# Patient Record
Sex: Male | Born: 1945 | Race: Black or African American | Hispanic: No | Marital: Married | Smoking: Never smoker
Health system: Southern US, Community
[De-identification: ages and names within clinical notes are randomized; demographics above are authoritative.]

## PROBLEM LIST (undated history)

## (undated) DIAGNOSIS — N189 Chronic kidney disease, unspecified: Secondary | ICD-10-CM

## (undated) DIAGNOSIS — I739 Peripheral vascular disease, unspecified: Secondary | ICD-10-CM

## (undated) DIAGNOSIS — D649 Anemia, unspecified: Secondary | ICD-10-CM

## (undated) DIAGNOSIS — M109 Gout, unspecified: Secondary | ICD-10-CM

## (undated) DIAGNOSIS — I2510 Atherosclerotic heart disease of native coronary artery without angina pectoris: Secondary | ICD-10-CM

## (undated) DIAGNOSIS — I10 Essential (primary) hypertension: Secondary | ICD-10-CM

## (undated) DIAGNOSIS — I639 Cerebral infarction, unspecified: Secondary | ICD-10-CM

## (undated) DIAGNOSIS — E119 Type 2 diabetes mellitus without complications: Secondary | ICD-10-CM

## (undated) DIAGNOSIS — G4730 Sleep apnea, unspecified: Secondary | ICD-10-CM

## (undated) HISTORY — PX: OTHER SURGICAL HISTORY: SHX169

---

## 2003-11-09 ENCOUNTER — Other Ambulatory Visit: Payer: Self-pay

## 2008-05-01 HISTORY — PX: PARATHYROIDECTOMY: SHX19

## 2008-07-20 ENCOUNTER — Ambulatory Visit: Payer: Self-pay | Admitting: Specialist

## 2008-07-27 ENCOUNTER — Ambulatory Visit: Payer: Self-pay | Admitting: Internal Medicine

## 2009-05-01 HISTORY — PX: AORTIC VALVE REPLACEMENT: SHX41

## 2009-09-17 IMAGING — CT CT CHEST W/ CM
1 series · 15 of 32 positions shown, 19 images · non-contrast
Comparison: none

REASON FOR EXAM: right lung abn chest Xray
COMMENTS:

[Series 2: soft tissue · axial · 0.72mm/px · z∈[+434,+674]mm · 15 of 54 slices shown, 19 images]
[im 4/54  soft-tissue]
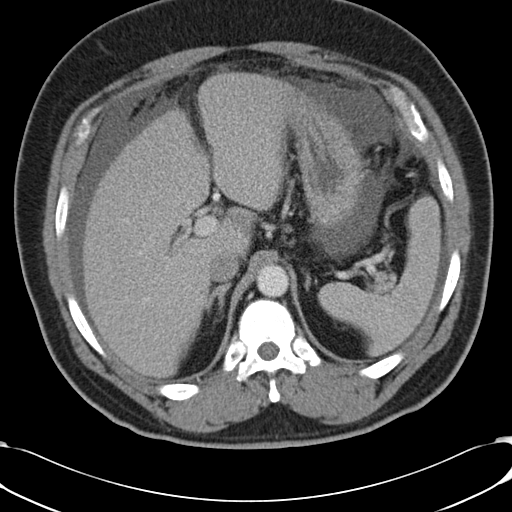
[im 4/54  bone]
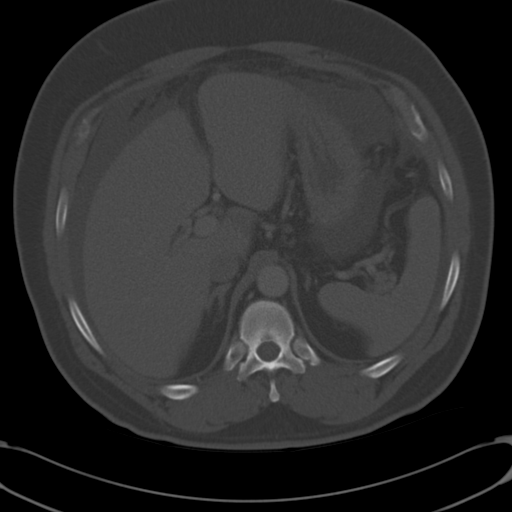
[im 7/54  soft-tissue]
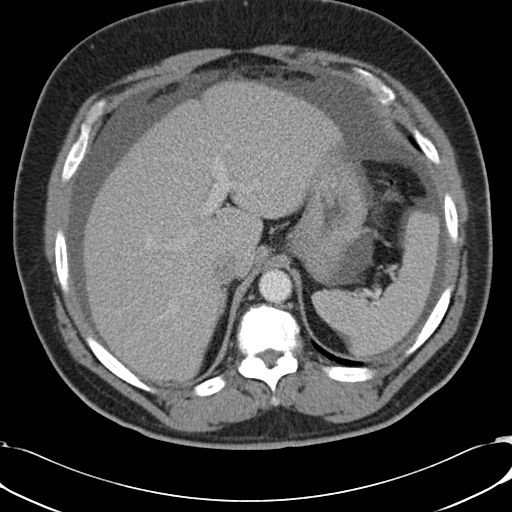
[im 11/54  soft-tissue]
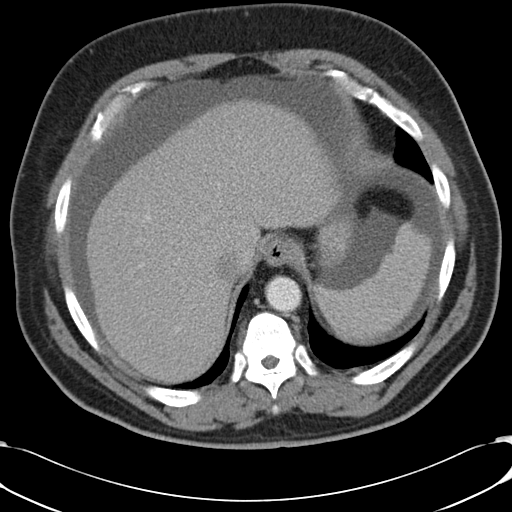
[im 16/54  soft-tissue]
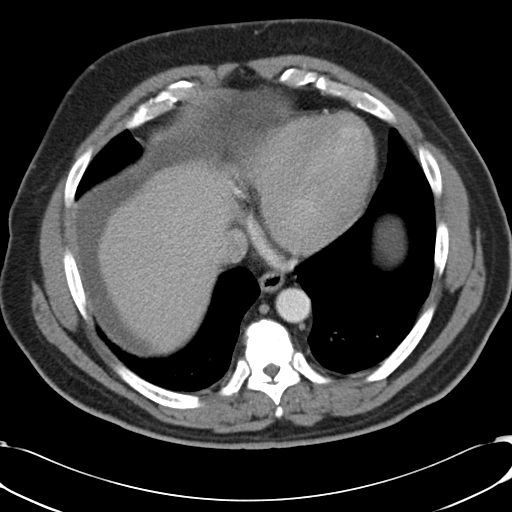
[im 19/54  soft-tissue]
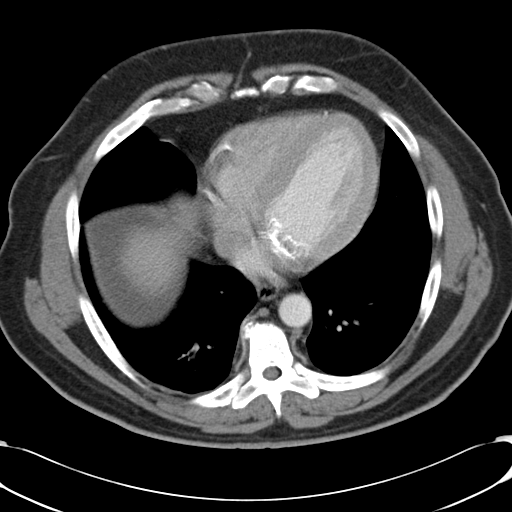
[im 23/54  soft-tissue]
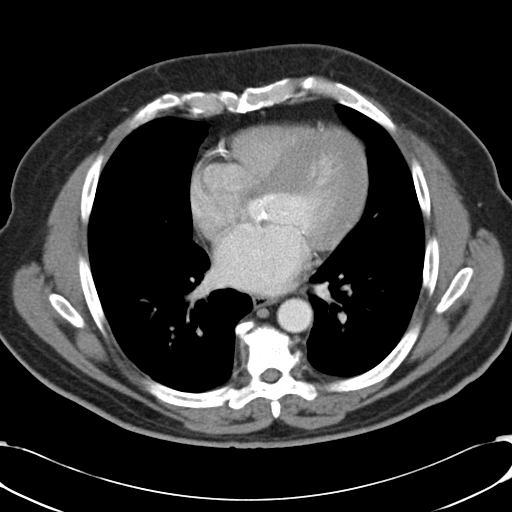
[im 28/54  soft-tissue]
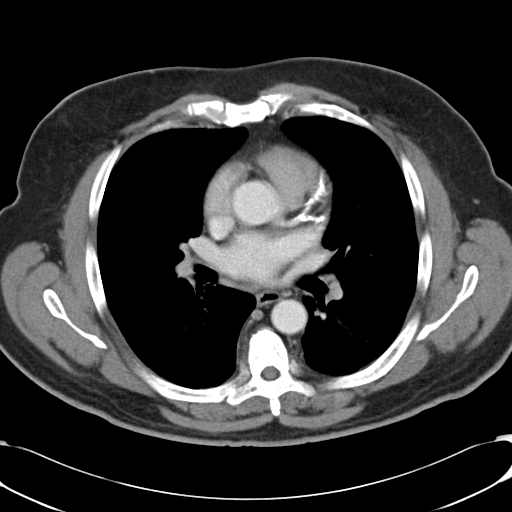
[im 31/54  soft-tissue]
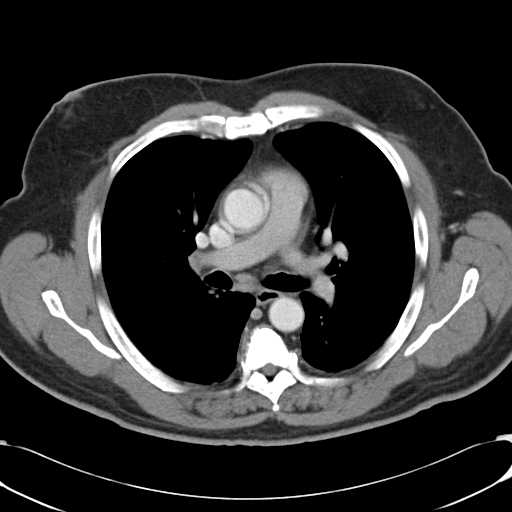
[im 35/54  soft-tissue]
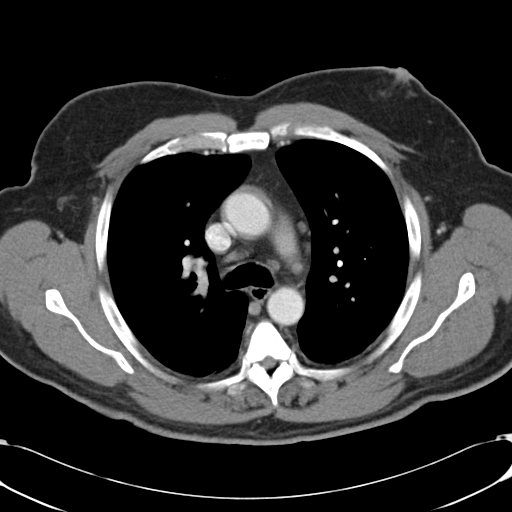
[im 35/54  bone]
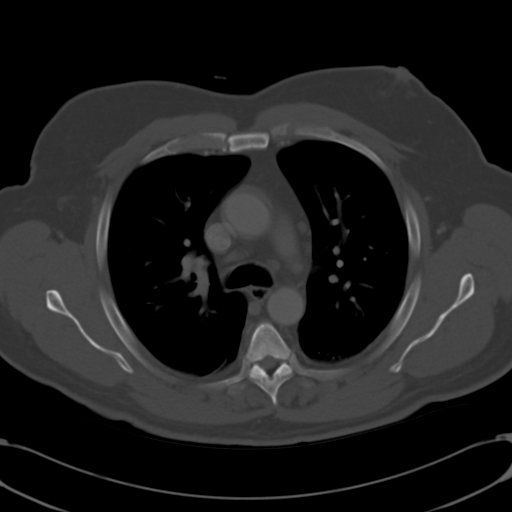
[im 38/54  soft-tissue]
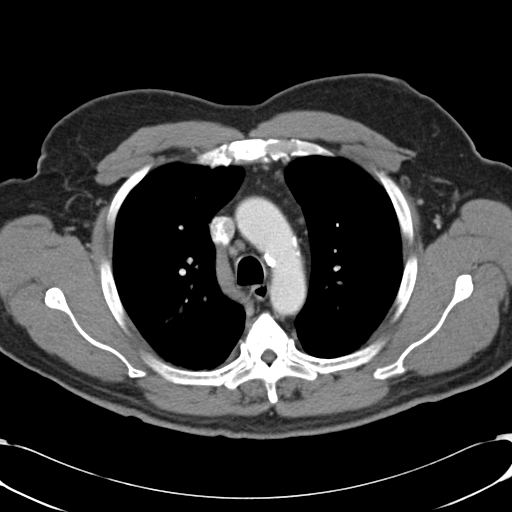
[im 43/54  soft-tissue]
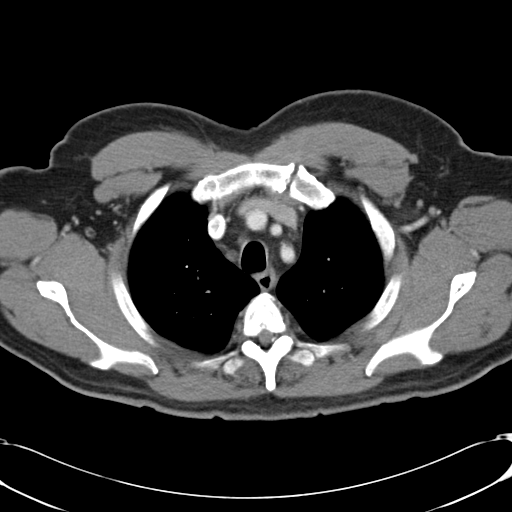
[im 47/54  soft-tissue]
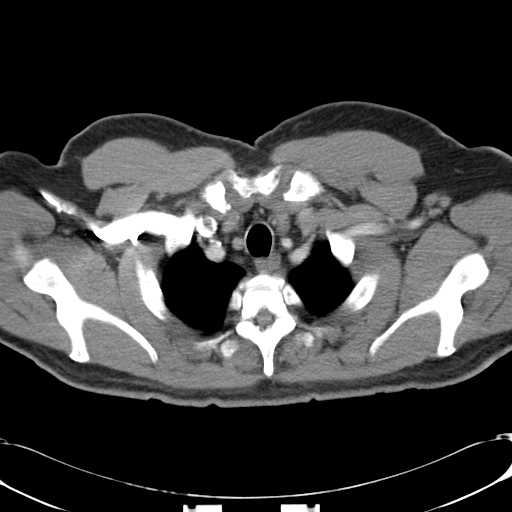
[im 47/54  lung]
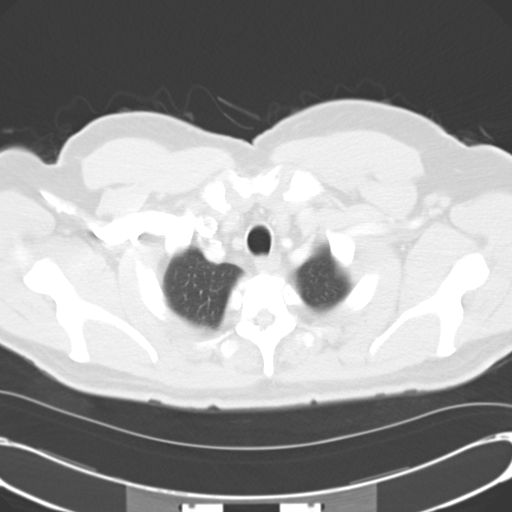
[im 48/54  lung]
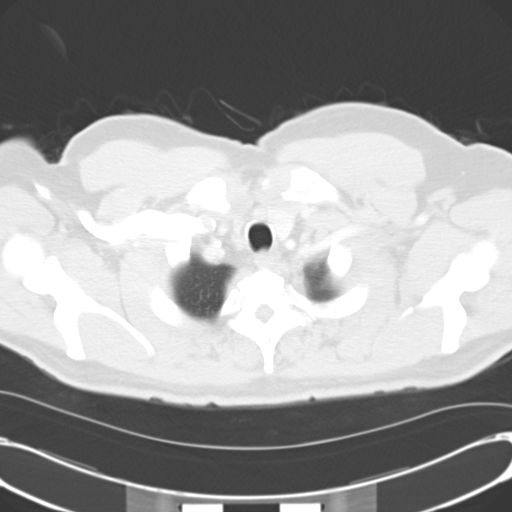
[im 50/54  soft-tissue]
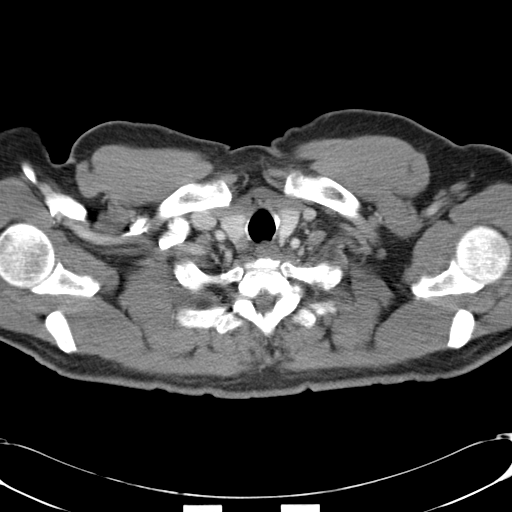
[im 50/54  lung]
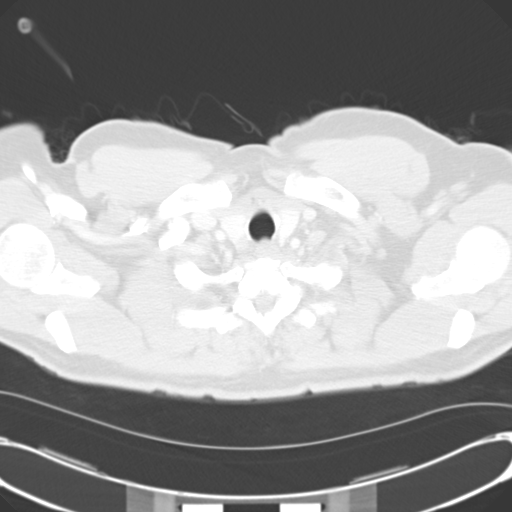
[im 52/54  lung]
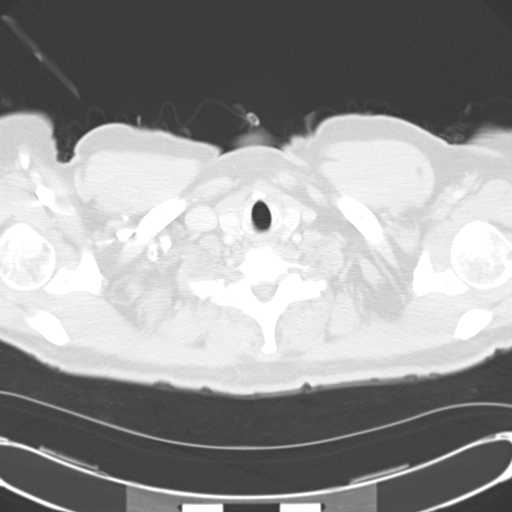

[15 of 32 positions shown; findings below may reference images not displayed]

PROCEDURE:     CT  - CT CHEST WITH CONTRAST  - July 20, 2008 [DATE]

RESULT:     Axial CT scanning was performed through the chest at 5 mm
intervals and slice thicknesses following intravenous ministration of 75 cc
of [id number]. Review of 3-dimensional reconstructed images was performed
separately on the [employer] monitor. The patient reportedly has an
abnormal chest x-ray and has cough.

At lung window settings there are minimally increased interstitial densities
posteriorly in the right lower lobe and to a lesser extent the left lower
lobe. I do not see alveolar infiltrates. There is no evidence of a
pneumothorax. There is no evidence of bronchiectasis. No pulmonary
parenchymal masses are identified.

The cardiac chambers are top normal in size. The caliber of the thoracic
aorta is normal. There is a large amount of ascites noted in the upper
abdomen. I see no pleural nor pericardial effusion. I do not see pathologic
sized mediastinal or hilar lymph nodes. No axillary lymphadenopathy is
evident. The thoracic vertebral bodies are preserved in height. There are
prominent Schmorl's nodes in the lower thoracic spine.

The liver exhibits no focal mass where visualized. The spleen does not
appear enlarged. The partially imaged adrenal glands are normal in
appearance.
IMPRESSION: 1. There is a large amount of ascites present in the upper abdomen. I do not
see pleural nor pericardial effusions.
2. I see no pulmonary parenchymal masses nor alveolar infiltrates. Minimally
increased interstitial density in the right lower lobe posteriorly is
nonspecific.
3. I see no mediastinal nor hilar lymphadenopathy.

Followup CT scanning through the abdomen and pelvis is recommended unless
the etiology for the patient's ascites is already known.

## 2010-05-01 DIAGNOSIS — N189 Chronic kidney disease, unspecified: Secondary | ICD-10-CM

## 2010-05-01 HISTORY — DX: Chronic kidney disease, unspecified: N18.9

## 2010-05-01 HISTORY — PX: KIDNEY TRANSPLANT: SHX239

## 2010-11-10 ENCOUNTER — Other Ambulatory Visit: Payer: Self-pay

## 2010-11-30 ENCOUNTER — Other Ambulatory Visit: Payer: Self-pay

## 2010-12-31 ENCOUNTER — Other Ambulatory Visit: Payer: Self-pay

## 2011-01-30 ENCOUNTER — Other Ambulatory Visit: Payer: Self-pay

## 2011-03-02 ENCOUNTER — Other Ambulatory Visit: Payer: Self-pay

## 2011-04-01 ENCOUNTER — Other Ambulatory Visit: Payer: Self-pay

## 2011-05-02 ENCOUNTER — Other Ambulatory Visit: Payer: Self-pay

## 2011-05-04 LAB — CBC WITH DIFFERENTIAL/PLATELET
Basophil #: 0 10*3/uL (ref 0.0–0.1)
Basophil %: 0.2 %
Eosinophil %: 0.7 %
HCT: 26.8 % — ABNORMAL LOW (ref 40.0–52.0)
HGB: 9.1 g/dL — ABNORMAL LOW (ref 13.0–18.0)
Lymphocyte #: 0.3 10*3/uL — ABNORMAL LOW (ref 1.0–3.6)
Lymphocyte %: 5 %
MCHC: 33.9 g/dL (ref 32.0–36.0)
MCV: 93 fL (ref 80–100)
Monocyte %: 10.6 %
Neutrophil #: 5.2 10*3/uL (ref 1.4–6.5)
RBC: 2.88 10*6/uL — ABNORMAL LOW (ref 4.40–5.90)
RDW: 15.3 % — ABNORMAL HIGH (ref 11.5–14.5)
WBC: 6.3 10*3/uL (ref 3.8–10.6)

## 2011-05-04 LAB — MAGNESIUM: Magnesium: 2.4 mg/dL

## 2011-05-04 LAB — BASIC METABOLIC PANEL
Calcium, Total: 7.2 mg/dL — ABNORMAL LOW (ref 8.5–10.1)
EGFR (African American): 13 — ABNORMAL LOW
EGFR (Non-African Amer.): 11 — ABNORMAL LOW
Glucose: 114 mg/dL — ABNORMAL HIGH (ref 65–99)
Sodium: 145 mmol/L (ref 136–145)

## 2011-05-04 LAB — PHOSPHORUS: Phosphorus: 3.4 mg/dL (ref 2.5–4.9)

## 2011-05-08 LAB — COMPREHENSIVE METABOLIC PANEL
Albumin: 3.3 g/dL — ABNORMAL LOW (ref 3.4–5.0)
Alkaline Phosphatase: 197 U/L — ABNORMAL HIGH (ref 50–136)
Anion Gap: 9 (ref 7–16)
Bilirubin,Total: 0.4 mg/dL (ref 0.2–1.0)
Calcium, Total: 7.6 mg/dL — ABNORMAL LOW (ref 8.5–10.1)
Chloride: 108 mmol/L — ABNORMAL HIGH (ref 98–107)
Creatinine: 5.05 mg/dL — ABNORMAL HIGH (ref 0.60–1.30)
EGFR (Non-African Amer.): 12 — ABNORMAL LOW
Glucose: 88 mg/dL (ref 65–99)
Osmolality: 313 (ref 275–301)
Potassium: 4.3 mmol/L (ref 3.5–5.1)
Sodium: 143 mmol/L (ref 136–145)
Total Protein: 6.8 g/dL (ref 6.4–8.2)

## 2011-05-08 LAB — MAGNESIUM: Magnesium: 2.3 mg/dL

## 2011-05-08 LAB — CBC WITH DIFFERENTIAL/PLATELET
Eosinophil #: 0 10*3/uL (ref 0.0–0.7)
Eosinophil %: 0.6 %
HCT: 27.4 % — ABNORMAL LOW (ref 40.0–52.0)
Lymphocyte #: 0.3 10*3/uL — ABNORMAL LOW (ref 1.0–3.6)
MCHC: 34.5 g/dL (ref 32.0–36.0)
MCV: 93 fL (ref 80–100)
Monocyte #: 0.7 10*3/uL (ref 0.0–0.7)
Neutrophil %: 83.1 %
Platelet: 145 10*3/uL — ABNORMAL LOW (ref 150–440)
RDW: 14.5 % (ref 11.5–14.5)
WBC: 6.3 10*3/uL (ref 3.8–10.6)

## 2011-05-08 LAB — LIPID PANEL
HDL Cholesterol: 51 mg/dL (ref 40–60)
VLDL Cholesterol, Calc: 26 mg/dL (ref 5–40)

## 2011-05-08 LAB — GAMMA GT: GGT: 774 U/L — ABNORMAL HIGH (ref 5–85)

## 2011-05-08 LAB — PHOSPHORUS: Phosphorus: 2.9 mg/dL (ref 2.5–4.9)

## 2011-05-11 LAB — CBC WITH DIFFERENTIAL/PLATELET
Basophil #: 0 10*3/uL (ref 0.0–0.1)
Basophil %: 0.6 %
Eosinophil #: 0 10*3/uL (ref 0.0–0.7)
HCT: 26.6 % — ABNORMAL LOW (ref 40.0–52.0)
HGB: 8.8 g/dL — ABNORMAL LOW (ref 13.0–18.0)
Lymphocyte %: 4.7 %
MCH: 31 pg (ref 26.0–34.0)
MCV: 94 fL (ref 80–100)
Monocyte %: 9.5 %
Neutrophil #: 5.8 10*3/uL (ref 1.4–6.5)
RDW: 14.4 % (ref 11.5–14.5)
WBC: 6.7 10*3/uL (ref 3.8–10.6)

## 2011-05-11 LAB — BASIC METABOLIC PANEL
Anion Gap: 10 (ref 7–16)
BUN: 93 mg/dL — ABNORMAL HIGH (ref 7–18)
Chloride: 107 mmol/L (ref 98–107)
Co2: 26 mmol/L (ref 21–32)
Creatinine: 5.2 mg/dL — ABNORMAL HIGH (ref 0.60–1.30)
EGFR (African American): 15 — ABNORMAL LOW
EGFR (Non-African Amer.): 12 — ABNORMAL LOW
Glucose: 110 mg/dL — ABNORMAL HIGH (ref 65–99)
Potassium: 4.5 mmol/L (ref 3.5–5.1)
Sodium: 143 mmol/L (ref 136–145)

## 2011-05-11 LAB — PHOSPHORUS: Phosphorus: 3.1 mg/dL (ref 2.5–4.9)

## 2011-05-15 LAB — BILIRUBIN, DIRECT: Bilirubin, Direct: 0.1 mg/dL (ref 0.00–0.20)

## 2011-05-15 LAB — COMPREHENSIVE METABOLIC PANEL
Albumin: 3.4 g/dL (ref 3.4–5.0)
Alkaline Phosphatase: 199 U/L — ABNORMAL HIGH (ref 50–136)
Anion Gap: 10 (ref 7–16)
BUN: 98 mg/dL — ABNORMAL HIGH (ref 7–18)
Bilirubin,Total: 0.5 mg/dL (ref 0.2–1.0)
Calcium, Total: 7.8 mg/dL — ABNORMAL LOW (ref 8.5–10.1)
Chloride: 103 mmol/L (ref 98–107)
Co2: 29 mmol/L (ref 21–32)
Creatinine: 5.45 mg/dL — ABNORMAL HIGH (ref 0.60–1.30)
EGFR (African American): 14 — ABNORMAL LOW
EGFR (Non-African Amer.): 11 — ABNORMAL LOW
Glucose: 124 mg/dL — ABNORMAL HIGH (ref 65–99)
Osmolality: 315 (ref 275–301)
Potassium: 4.5 mmol/L (ref 3.5–5.1)
SGOT(AST): 43 U/L — ABNORMAL HIGH (ref 15–37)
SGPT (ALT): 87 U/L — ABNORMAL HIGH
Sodium: 142 mmol/L (ref 136–145)
Total Protein: 6.6 g/dL (ref 6.4–8.2)

## 2011-05-15 LAB — CBC WITH DIFFERENTIAL/PLATELET
Basophil %: 1.1 %
Eosinophil %: 0.3 %
HGB: 9.1 g/dL — ABNORMAL LOW (ref 13.0–18.0)
Lymphocyte #: 0.3 10*3/uL — ABNORMAL LOW (ref 1.0–3.6)
MCV: 95 fL (ref 80–100)
Monocyte #: 0.9 10*3/uL — ABNORMAL HIGH (ref 0.0–0.7)
Platelet: 155 10*3/uL (ref 150–440)
RBC: 2.99 10*6/uL — ABNORMAL LOW (ref 4.40–5.90)
RDW: 16.1 % — ABNORMAL HIGH (ref 11.5–14.5)
WBC: 7.6 10*3/uL (ref 3.8–10.6)

## 2011-05-15 LAB — GAMMA GT: GGT: 741 U/L — ABNORMAL HIGH (ref 5–85)

## 2011-05-15 LAB — MAGNESIUM: Magnesium: 2.4 mg/dL

## 2011-05-15 LAB — PHOSPHORUS: Phosphorus: 3.3 mg/dL (ref 2.5–4.9)

## 2011-05-22 LAB — CBC WITH DIFFERENTIAL/PLATELET
Eosinophil #: 0 10*3/uL (ref 0.0–0.7)
HCT: 31.6 % — ABNORMAL LOW (ref 40.0–52.0)
HGB: 10.8 g/dL — ABNORMAL LOW (ref 13.0–18.0)
Lymphocyte %: 6.7 %
MCV: 95 fL (ref 80–100)
Monocyte #: 0.7 10*3/uL (ref 0.0–0.7)
Monocyte %: 13.3 %
Neutrophil %: 75 %
Platelet: 152 10*3/uL (ref 150–440)
RDW: 15.2 % — ABNORMAL HIGH (ref 11.5–14.5)
WBC: 5.6 10*3/uL (ref 3.8–10.6)

## 2011-05-22 LAB — GAMMA GT: GGT: 665 U/L — ABNORMAL HIGH (ref 5–85)

## 2011-05-22 LAB — COMPREHENSIVE METABOLIC PANEL
Alkaline Phosphatase: 165 U/L — ABNORMAL HIGH (ref 50–136)
Bilirubin,Total: 0.6 mg/dL (ref 0.2–1.0)
Calcium, Total: 8.2 mg/dL — ABNORMAL LOW (ref 8.5–10.1)
Co2: 31 mmol/L (ref 21–32)
EGFR (African American): 12 — ABNORMAL LOW
EGFR (Non-African Amer.): 10 — ABNORMAL LOW
Osmolality: 320 (ref 275–301)
SGOT(AST): 33 U/L (ref 15–37)
Sodium: 144 mmol/L (ref 136–145)

## 2011-05-22 LAB — PHOSPHORUS: Phosphorus: 4.6 mg/dL (ref 2.5–4.9)

## 2011-05-29 LAB — COMPREHENSIVE METABOLIC PANEL
Albumin: 3.7 g/dL (ref 3.4–5.0)
Alkaline Phosphatase: 155 U/L — ABNORMAL HIGH (ref 50–136)
BUN: 116 mg/dL — ABNORMAL HIGH (ref 7–18)
Bilirubin,Total: 0.5 mg/dL (ref 0.2–1.0)
Chloride: 102 mmol/L (ref 98–107)
Co2: 28 mmol/L (ref 21–32)
Creatinine: 6.47 mg/dL — ABNORMAL HIGH (ref 0.60–1.30)
EGFR (African American): 11 — ABNORMAL LOW
EGFR (Non-African Amer.): 9 — ABNORMAL LOW
Osmolality: 321 (ref 275–301)
SGPT (ALT): 52 U/L
Sodium: 142 mmol/L (ref 136–145)
Total Protein: 6.9 g/dL (ref 6.4–8.2)

## 2011-05-29 LAB — CBC WITH DIFFERENTIAL/PLATELET
Basophil #: 0 10*3/uL (ref 0.0–0.1)
Eosinophil %: 0.7 %
HGB: 10.5 g/dL — ABNORMAL LOW (ref 13.0–18.0)
Lymphocyte #: 0.3 10*3/uL — ABNORMAL LOW (ref 1.0–3.6)
MCV: 94 fL (ref 80–100)
Monocyte #: 0.6 10*3/uL (ref 0.0–0.7)
Monocyte %: 12.1 %
Neutrophil %: 80.3 %
Platelet: 125 10*3/uL — ABNORMAL LOW (ref 150–440)
RBC: 3.25 10*6/uL — ABNORMAL LOW (ref 4.40–5.90)
RDW: 14.8 % — ABNORMAL HIGH (ref 11.5–14.5)
WBC: 5.2 10*3/uL (ref 3.8–10.6)

## 2011-05-29 LAB — BILIRUBIN, DIRECT: Bilirubin, Direct: 0.1 mg/dL (ref 0.00–0.20)

## 2011-05-29 LAB — PHOSPHORUS: Phosphorus: 3.8 mg/dL (ref 2.5–4.9)

## 2011-05-29 LAB — GAMMA GT: GGT: 561 U/L — ABNORMAL HIGH (ref 5–85)

## 2011-05-29 LAB — MAGNESIUM: Magnesium: 2.7 mg/dL — ABNORMAL HIGH

## 2011-06-02 ENCOUNTER — Other Ambulatory Visit: Payer: Self-pay

## 2011-06-05 LAB — CBC WITH DIFFERENTIAL/PLATELET
HCT: 34.2 % — ABNORMAL LOW (ref 40.0–52.0)
Lymphocytes: 7 %
MCH: 32.2 pg (ref 26.0–34.0)
MCV: 95 fL (ref 80–100)
Platelet: 179 10*3/uL (ref 150–440)
RBC: 3.59 10*6/uL — ABNORMAL LOW (ref 4.40–5.90)
RDW: 14.6 % — ABNORMAL HIGH (ref 11.5–14.5)
WBC: 8.3 10*3/uL (ref 3.8–10.6)

## 2011-06-05 LAB — BASIC METABOLIC PANEL
BUN: 95 mg/dL — ABNORMAL HIGH (ref 7–18)
Calcium, Total: 9.2 mg/dL (ref 8.5–10.1)
Creatinine: 5.97 mg/dL — ABNORMAL HIGH (ref 0.60–1.30)
EGFR (African American): 12 — ABNORMAL LOW
EGFR (Non-African Amer.): 10 — ABNORMAL LOW
Glucose: 119 mg/dL — ABNORMAL HIGH (ref 65–99)
Osmolality: 314 (ref 275–301)
Potassium: 4.2 mmol/L (ref 3.5–5.1)

## 2011-06-05 LAB — MAGNESIUM: Magnesium: 2.7 mg/dL — ABNORMAL HIGH

## 2011-06-19 LAB — BASIC METABOLIC PANEL
Calcium, Total: 8.2 mg/dL — ABNORMAL LOW (ref 8.5–10.1)
Co2: 27 mmol/L (ref 21–32)
EGFR (African American): 15 — ABNORMAL LOW
Glucose: 108 mg/dL — ABNORMAL HIGH (ref 65–99)
Osmolality: 304 (ref 275–301)
Potassium: 4.2 mmol/L (ref 3.5–5.1)
Sodium: 139 mmol/L (ref 136–145)

## 2011-06-19 LAB — PHOSPHORUS: Phosphorus: 3.2 mg/dL (ref 2.5–4.9)

## 2011-06-19 LAB — CBC WITH DIFFERENTIAL/PLATELET
Basophil #: 0 10*3/uL (ref 0.0–0.1)
Basophil %: 0 %
Eosinophil #: 0 10*3/uL (ref 0.0–0.7)
Eosinophil %: 0.6 %
HGB: 10.2 g/dL — ABNORMAL LOW (ref 13.0–18.0)
Lymphocyte #: 0.4 10*3/uL — ABNORMAL LOW (ref 1.0–3.6)
MCH: 30.5 pg (ref 26.0–34.0)
MCHC: 31.8 g/dL — ABNORMAL LOW (ref 32.0–36.0)
MCV: 96 fL (ref 80–100)
Monocyte #: 0.6 10*3/uL (ref 0.0–0.7)
Neutrophil %: 84.9 %

## 2011-06-26 LAB — BASIC METABOLIC PANEL
Anion Gap: 8 (ref 7–16)
BUN: 82 mg/dL — ABNORMAL HIGH (ref 7–18)
Chloride: 103 mmol/L (ref 98–107)
Co2: 29 mmol/L (ref 21–32)
Creatinine: 5.11 mg/dL — ABNORMAL HIGH (ref 0.60–1.30)
EGFR (African American): 15 — ABNORMAL LOW
Glucose: 113 mg/dL — ABNORMAL HIGH (ref 65–99)
Osmolality: 305 (ref 275–301)

## 2011-06-26 LAB — CBC WITH DIFFERENTIAL/PLATELET
Basophil %: 4.5 %
Eosinophil #: 0 10*3/uL (ref 0.0–0.7)
HCT: 30.4 % — ABNORMAL LOW (ref 40.0–52.0)
MCH: 30.5 pg (ref 26.0–34.0)
MCHC: 32.4 g/dL (ref 32.0–36.0)
Monocyte #: 0.5 10*3/uL (ref 0.0–0.7)
Monocyte %: 8.6 %
Neutrophil #: 5 10*3/uL (ref 1.4–6.5)
Neutrophil %: 80.1 %
Platelet: 180 10*3/uL (ref 150–440)
RBC: 3.23 10*6/uL — ABNORMAL LOW (ref 4.40–5.90)
RDW: 14.2 % (ref 11.5–14.5)
WBC: 6.2 10*3/uL (ref 3.8–10.6)

## 2011-06-26 LAB — PHOSPHORUS: Phosphorus: 3.4 mg/dL (ref 2.5–4.9)

## 2011-06-30 ENCOUNTER — Other Ambulatory Visit: Payer: Self-pay

## 2011-07-03 LAB — CBC WITH DIFFERENTIAL/PLATELET
Basophil %: 0 %
Eosinophil #: 0 10*3/uL (ref 0.0–0.7)
Eosinophil %: 0.4 %
HGB: 10.1 g/dL — ABNORMAL LOW (ref 13.0–18.0)
Lymphocyte #: 0.3 10*3/uL — ABNORMAL LOW (ref 1.0–3.6)
MCH: 30.8 pg (ref 26.0–34.0)
MCV: 97 fL (ref 80–100)
Monocyte #: 0.9 10*3/uL — ABNORMAL HIGH (ref 0.0–0.7)
Monocyte %: 11.6 %
Neutrophil %: 84.4 %
Platelet: 193 10*3/uL (ref 150–440)
RBC: 3.29 10*6/uL — ABNORMAL LOW (ref 4.40–5.90)
RDW: 16.3 % — ABNORMAL HIGH (ref 11.5–14.5)
WBC: 7.4 10*3/uL (ref 3.8–10.6)

## 2011-07-03 LAB — MAGNESIUM: Magnesium: 2.3 mg/dL

## 2011-07-03 LAB — BASIC METABOLIC PANEL
Anion Gap: 13 (ref 7–16)
BUN: 77 mg/dL — ABNORMAL HIGH (ref 7–18)
Chloride: 106 mmol/L (ref 98–107)
Co2: 25 mmol/L (ref 21–32)
EGFR (African American): 15 — ABNORMAL LOW
Osmolality: 310 (ref 275–301)

## 2011-07-10 LAB — BASIC METABOLIC PANEL
Calcium, Total: 7.5 mg/dL — ABNORMAL LOW (ref 8.5–10.1)
Chloride: 107 mmol/L (ref 98–107)
Co2: 26 mmol/L (ref 21–32)
Creatinine: 5.04 mg/dL — ABNORMAL HIGH (ref 0.60–1.30)
EGFR (African American): 15 — ABNORMAL LOW
Osmolality: 315 (ref 275–301)
Potassium: 4.3 mmol/L (ref 3.5–5.1)
Sodium: 145 mmol/L (ref 136–145)

## 2011-07-10 LAB — CBC WITH DIFFERENTIAL/PLATELET
Basophil #: 0 10*3/uL (ref 0.0–0.1)
Basophil %: 0 %
Eosinophil #: 0 10*3/uL (ref 0.0–0.7)
Eosinophil %: 0.7 %
HCT: 31.4 % — ABNORMAL LOW (ref 40.0–52.0)
HGB: 10.1 g/dL — ABNORMAL LOW (ref 13.0–18.0)
Lymphocyte #: 0.3 10*3/uL — ABNORMAL LOW (ref 1.0–3.6)
Lymphocyte %: 4.7 %
MCH: 31.1 pg (ref 26.0–34.0)
MCHC: 32.3 g/dL (ref 32.0–36.0)
MCV: 96 fL (ref 80–100)
Monocyte #: 0.7 10*3/uL (ref 0.0–0.7)
Monocyte %: 11.2 %
Neutrophil #: 5.5 10*3/uL (ref 1.4–6.5)
Neutrophil %: 83.4 %
Platelet: 134 10*3/uL — ABNORMAL LOW (ref 150–440)
RBC: 3.26 10*6/uL — ABNORMAL LOW (ref 4.40–5.90)
RDW: 16.9 % — ABNORMAL HIGH (ref 11.5–14.5)
WBC: 6.5 10*3/uL (ref 3.8–10.6)

## 2011-07-10 LAB — MAGNESIUM: Magnesium: 2.4 mg/dL

## 2011-07-10 LAB — PHOSPHORUS: Phosphorus: 4.1 mg/dL (ref 2.5–4.9)

## 2011-07-17 LAB — CBC WITH DIFFERENTIAL/PLATELET
Basophil %: 0.7 %
Eosinophil %: 0.8 %
HGB: 10.3 g/dL — ABNORMAL LOW (ref 13.0–18.0)
Lymphocyte #: 0.3 10*3/uL — ABNORMAL LOW (ref 1.0–3.6)
MCH: 31.6 pg (ref 26.0–34.0)
MCV: 96 fL (ref 80–100)
Monocyte #: 0.7 10*3/uL (ref 0.0–0.7)
RBC: 3.26 10*6/uL — ABNORMAL LOW (ref 4.40–5.90)
RDW: 15.7 % — ABNORMAL HIGH (ref 11.5–14.5)

## 2011-07-17 LAB — BASIC METABOLIC PANEL
Anion Gap: 13 (ref 7–16)
Calcium, Total: 6.6 mg/dL — CL (ref 8.5–10.1)
Chloride: 108 mmol/L — ABNORMAL HIGH (ref 98–107)
Co2: 25 mmol/L (ref 21–32)
EGFR (Non-African Amer.): 12 — ABNORMAL LOW
Osmolality: 320 (ref 275–301)
Potassium: 4.3 mmol/L (ref 3.5–5.1)

## 2011-07-17 LAB — PHOSPHORUS: Phosphorus: 4.7 mg/dL (ref 2.5–4.9)

## 2011-07-31 ENCOUNTER — Other Ambulatory Visit: Payer: Self-pay

## 2011-07-31 LAB — PHOSPHORUS: Phosphorus: 4.5 mg/dL (ref 2.5–4.9)

## 2011-07-31 LAB — BASIC METABOLIC PANEL
Anion Gap: 13 (ref 7–16)
BUN: 97 mg/dL — ABNORMAL HIGH (ref 7–18)
Chloride: 104 mmol/L (ref 98–107)
Co2: 27 mmol/L (ref 21–32)
EGFR (African American): 12 — ABNORMAL LOW
EGFR (Non-African Amer.): 10 — ABNORMAL LOW
Potassium: 4.1 mmol/L (ref 3.5–5.1)

## 2011-07-31 LAB — MAGNESIUM: Magnesium: 2.4 mg/dL

## 2011-07-31 LAB — CBC WITH DIFFERENTIAL/PLATELET
Basophil #: 0 10*3/uL (ref 0.0–0.1)
Basophil %: 0.2 %
Eosinophil #: 0.1 10*3/uL (ref 0.0–0.7)
HGB: 10.9 g/dL — ABNORMAL LOW (ref 13.0–18.0)
MCH: 31.4 pg (ref 26.0–34.0)
MCHC: 33.2 g/dL (ref 32.0–36.0)
MCV: 95 fL (ref 80–100)
Monocyte #: 1.2 10*3/uL — ABNORMAL HIGH (ref 0.0–0.7)
Monocyte %: 18 %

## 2011-08-07 LAB — CBC WITH DIFFERENTIAL/PLATELET
Basophil #: 0 10*3/uL (ref 0.0–0.1)
Basophil %: 0.5 %
Eosinophil %: 1.1 %
HGB: 10.7 g/dL — ABNORMAL LOW (ref 13.0–18.0)
Lymphocyte #: 0.3 10*3/uL — ABNORMAL LOW (ref 1.0–3.6)
Lymphocyte %: 4.8 %
MCH: 31.4 pg (ref 26.0–34.0)
Monocyte #: 0.9 10*3/uL — ABNORMAL HIGH (ref 0.0–0.7)
Monocyte %: 15.8 %
Neutrophil #: 4.6 10*3/uL (ref 1.4–6.5)
Neutrophil %: 77.8 %
Platelet: 183 10*3/uL (ref 150–440)
RBC: 3.41 10*6/uL — ABNORMAL LOW (ref 4.40–5.90)

## 2011-08-07 LAB — PHOSPHORUS: Phosphorus: 4.4 mg/dL (ref 2.5–4.9)

## 2011-08-07 LAB — BASIC METABOLIC PANEL
Anion Gap: 12 (ref 7–16)
BUN: 90 mg/dL — ABNORMAL HIGH (ref 7–18)
Calcium, Total: 7.9 mg/dL — ABNORMAL LOW (ref 8.5–10.1)
Chloride: 103 mmol/L (ref 98–107)
Creatinine: 5.57 mg/dL — ABNORMAL HIGH (ref 0.60–1.30)
EGFR (African American): 13 — ABNORMAL LOW
EGFR (Non-African Amer.): 11 — ABNORMAL LOW
Osmolality: 311 (ref 275–301)

## 2011-08-07 LAB — MAGNESIUM: Magnesium: 2.5 mg/dL — ABNORMAL HIGH

## 2011-08-14 LAB — CBC WITH DIFFERENTIAL/PLATELET
Basophil #: 0 10*3/uL (ref 0.0–0.1)
Basophil %: 0.6 %
Eosinophil %: 1.6 %
HGB: 11 g/dL — ABNORMAL LOW (ref 13.0–18.0)
Lymphocyte #: 0.2 10*3/uL — ABNORMAL LOW (ref 1.0–3.6)
Lymphocyte %: 4.4 %
MCH: 30.6 pg (ref 26.0–34.0)
MCV: 95 fL (ref 80–100)
Monocyte #: 0.7 x10 3/mm (ref 0.2–1.0)
Monocyte %: 14.1 %
Neutrophil #: 4.1 10*3/uL (ref 1.4–6.5)
Neutrophil %: 79.3 %
RBC: 3.59 10*6/uL — ABNORMAL LOW (ref 4.40–5.90)
RDW: 16.6 % — ABNORMAL HIGH (ref 11.5–14.5)
WBC: 5.2 10*3/uL (ref 3.8–10.6)

## 2011-08-14 LAB — BASIC METABOLIC PANEL
Anion Gap: 10 (ref 7–16)
Calcium, Total: 7.3 mg/dL — ABNORMAL LOW (ref 8.5–10.1)
Chloride: 103 mmol/L (ref 98–107)
Co2: 28 mmol/L (ref 21–32)
Creatinine: 5.37 mg/dL — ABNORMAL HIGH (ref 0.60–1.30)
EGFR (African American): 12 — ABNORMAL LOW
EGFR (Non-African Amer.): 10 — ABNORMAL LOW
Glucose: 115 mg/dL — ABNORMAL HIGH (ref 65–99)
Osmolality: 310 (ref 275–301)
Potassium: 4.1 mmol/L (ref 3.5–5.1)

## 2011-08-14 LAB — MAGNESIUM: Magnesium: 2.4 mg/dL

## 2011-08-14 LAB — PHOSPHORUS: Phosphorus: 4.1 mg/dL (ref 2.5–4.9)

## 2011-08-21 LAB — BASIC METABOLIC PANEL
BUN: 95 mg/dL — ABNORMAL HIGH (ref 7–18)
Calcium, Total: 8.1 mg/dL — ABNORMAL LOW (ref 8.5–10.1)
Chloride: 104 mmol/L (ref 98–107)
Co2: 28 mmol/L (ref 21–32)
Creatinine: 5.26 mg/dL — ABNORMAL HIGH (ref 0.60–1.30)
EGFR (Non-African Amer.): 11 — ABNORMAL LOW
Glucose: 120 mg/dL — ABNORMAL HIGH (ref 65–99)
Osmolality: 312 (ref 275–301)

## 2011-08-21 LAB — CBC WITH DIFFERENTIAL/PLATELET
Eosinophil #: 0.1 10*3/uL (ref 0.0–0.7)
HCT: 35.1 % — ABNORMAL LOW (ref 40.0–52.0)
HGB: 11.2 g/dL — ABNORMAL LOW (ref 13.0–18.0)
Lymphocyte %: 4.2 %
MCH: 30.4 pg (ref 26.0–34.0)
MCHC: 32 g/dL (ref 32.0–36.0)
Monocyte #: 0.6 x10 3/mm (ref 0.2–1.0)
Neutrophil #: 5.9 10*3/uL (ref 1.4–6.5)
Neutrophil %: 86.1 %
Platelet: 217 10*3/uL (ref 150–440)
RDW: 16.2 % — ABNORMAL HIGH (ref 11.5–14.5)

## 2011-08-21 LAB — MAGNESIUM: Magnesium: 2.1 mg/dL

## 2011-08-21 LAB — PHOSPHORUS: Phosphorus: 3.7 mg/dL (ref 2.5–4.9)

## 2011-08-30 ENCOUNTER — Other Ambulatory Visit: Payer: Self-pay

## 2011-09-18 LAB — CBC WITH DIFFERENTIAL/PLATELET
HGB: 10.9 g/dL — ABNORMAL LOW (ref 13.0–18.0)
Lymphocyte %: 4.8 %
MCH: 30.9 pg (ref 26.0–34.0)
MCHC: 33 g/dL (ref 32.0–36.0)
MCV: 94 fL (ref 80–100)
Neutrophil %: 81 %
Platelet: 148 10*3/uL — ABNORMAL LOW (ref 150–440)
WBC: 5.2 10*3/uL (ref 3.8–10.6)

## 2011-09-18 LAB — COMPREHENSIVE METABOLIC PANEL
Alkaline Phosphatase: 139 U/L — ABNORMAL HIGH (ref 50–136)
BUN: 105 mg/dL — ABNORMAL HIGH (ref 7–18)
Bilirubin,Total: 0.4 mg/dL (ref 0.2–1.0)
Chloride: 103 mmol/L (ref 98–107)
Co2: 27 mmol/L (ref 21–32)
Creatinine: 5.77 mg/dL — ABNORMAL HIGH (ref 0.60–1.30)
EGFR (African American): 11 — ABNORMAL LOW
EGFR (Non-African Amer.): 10 — ABNORMAL LOW
Glucose: 111 mg/dL — ABNORMAL HIGH (ref 65–99)
Osmolality: 311 (ref 275–301)
Potassium: 4.8 mmol/L (ref 3.5–5.1)
SGOT(AST): 24 U/L (ref 15–37)
SGPT (ALT): 32 U/L
Sodium: 139 mmol/L (ref 136–145)
Total Protein: 7.7 g/dL (ref 6.4–8.2)

## 2011-09-30 ENCOUNTER — Other Ambulatory Visit: Payer: Self-pay

## 2011-10-02 LAB — CBC WITH DIFFERENTIAL/PLATELET
Basophil #: 0.1 10*3/uL (ref 0.0–0.1)
Basophil %: 2.1 %
Eosinophil %: 2 %
HCT: 31.4 % — ABNORMAL LOW (ref 40.0–52.0)
HGB: 10.4 g/dL — ABNORMAL LOW (ref 13.0–18.0)
Lymphocyte %: 3.2 %
MCH: 31.4 pg (ref 26.0–34.0)
MCV: 95 fL (ref 80–100)
Monocyte #: 0.8 x10 3/mm (ref 0.2–1.0)
Monocyte %: 18.5 %
Neutrophil #: 3.4 10*3/uL (ref 1.4–6.5)
Platelet: 137 10*3/uL — ABNORMAL LOW (ref 150–440)
RDW: 15.5 % — ABNORMAL HIGH (ref 11.5–14.5)
WBC: 4.5 10*3/uL (ref 3.8–10.6)

## 2011-10-02 LAB — BASIC METABOLIC PANEL
Anion Gap: 10 (ref 7–16)
BUN: 118 mg/dL — ABNORMAL HIGH (ref 7–18)
Calcium, Total: 9.9 mg/dL (ref 8.5–10.1)
Chloride: 105 mmol/L (ref 98–107)
Glucose: 124 mg/dL — ABNORMAL HIGH (ref 65–99)
Potassium: 4.4 mmol/L (ref 3.5–5.1)
Sodium: 141 mmol/L (ref 136–145)

## 2011-10-02 LAB — MAGNESIUM: Magnesium: 2.3 mg/dL

## 2011-10-02 LAB — PHOSPHORUS: Phosphorus: 4.4 mg/dL (ref 2.5–4.9)

## 2011-10-05 LAB — CBC WITH DIFFERENTIAL/PLATELET
Basophil #: 0 10*3/uL (ref 0.0–0.1)
Basophil %: 0.3 %
Eosinophil #: 0 10*3/uL (ref 0.0–0.7)
HCT: 30.9 % — ABNORMAL LOW (ref 40.0–52.0)
HGB: 10.2 g/dL — ABNORMAL LOW (ref 13.0–18.0)
Lymphocyte %: 3.8 %
MCV: 94 fL (ref 80–100)
Monocyte #: 0.4 x10 3/mm (ref 0.2–1.0)
Neutrophil #: 4.2 10*3/uL (ref 1.4–6.5)
Neutrophil %: 87.4 %
RBC: 3.28 10*6/uL — ABNORMAL LOW (ref 4.40–5.90)
RDW: 15.5 % — ABNORMAL HIGH (ref 11.5–14.5)
WBC: 4.8 10*3/uL (ref 3.8–10.6)

## 2011-10-05 LAB — BASIC METABOLIC PANEL
BUN: 119 mg/dL — ABNORMAL HIGH (ref 7–18)
Calcium, Total: 9.3 mg/dL (ref 8.5–10.1)
Chloride: 102 mmol/L (ref 98–107)
Co2: 25 mmol/L (ref 21–32)
Creatinine: 6.39 mg/dL — ABNORMAL HIGH (ref 0.60–1.30)
EGFR (African American): 10 — ABNORMAL LOW
EGFR (Non-African Amer.): 8 — ABNORMAL LOW
Glucose: 137 mg/dL — ABNORMAL HIGH (ref 65–99)
Osmolality: 314 (ref 275–301)
Potassium: 4.6 mmol/L (ref 3.5–5.1)

## 2011-10-05 LAB — MAGNESIUM: Magnesium: 2.7 mg/dL — ABNORMAL HIGH

## 2011-10-05 LAB — PHOSPHORUS: Phosphorus: 4.1 mg/dL (ref 2.5–4.9)

## 2011-10-16 LAB — BASIC METABOLIC PANEL
Anion Gap: 9 (ref 7–16)
BUN: 93 mg/dL — ABNORMAL HIGH (ref 7–18)
Chloride: 107 mmol/L (ref 98–107)
Co2: 25 mmol/L (ref 21–32)
Creatinine: 5.53 mg/dL — ABNORMAL HIGH (ref 0.60–1.30)
EGFR (Non-African Amer.): 10 — ABNORMAL LOW
Potassium: 4.7 mmol/L (ref 3.5–5.1)
Sodium: 141 mmol/L (ref 136–145)

## 2011-10-16 LAB — CBC WITH DIFFERENTIAL/PLATELET
Basophil #: 0 10*3/uL (ref 0.0–0.1)
Basophil %: 0.8 %
HCT: 30.1 % — ABNORMAL LOW (ref 40.0–52.0)
Lymphocyte #: 0.2 10*3/uL — ABNORMAL LOW (ref 1.0–3.6)
Lymphocyte %: 3.8 %
MCH: 30.9 pg (ref 26.0–34.0)
MCHC: 32.6 g/dL (ref 32.0–36.0)
MCV: 95 fL (ref 80–100)
Monocyte #: 0.5 x10 3/mm (ref 0.2–1.0)
Monocyte %: 9.1 %
Platelet: 146 10*3/uL — ABNORMAL LOW (ref 150–440)
WBC: 5.3 10*3/uL (ref 3.8–10.6)

## 2011-10-16 LAB — PHOSPHORUS: Phosphorus: 3.3 mg/dL (ref 2.5–4.9)

## 2011-10-30 ENCOUNTER — Other Ambulatory Visit: Payer: Self-pay

## 2011-11-06 LAB — BASIC METABOLIC PANEL
Calcium, Total: 9.5 mg/dL (ref 8.5–10.1)
Chloride: 108 mmol/L — ABNORMAL HIGH (ref 98–107)
Co2: 27 mmol/L (ref 21–32)
Creatinine: 4.14 mg/dL — ABNORMAL HIGH (ref 0.60–1.30)
EGFR (Non-African Amer.): 14 — ABNORMAL LOW
Glucose: 111 mg/dL — ABNORMAL HIGH (ref 65–99)
Osmolality: 307 (ref 275–301)
Potassium: 4.6 mmol/L (ref 3.5–5.1)
Sodium: 143 mmol/L (ref 136–145)

## 2011-11-06 LAB — CBC WITH DIFFERENTIAL/PLATELET
Basophil %: 0.7 %
Eosinophil #: 0 10*3/uL (ref 0.0–0.7)
Eosinophil %: 0.5 %
HCT: 33 % — ABNORMAL LOW (ref 40.0–52.0)
HGB: 10.9 g/dL — ABNORMAL LOW (ref 13.0–18.0)
Lymphocyte %: 4.2 %
MCH: 31.8 pg (ref 26.0–34.0)
MCHC: 33 g/dL (ref 32.0–36.0)
MCV: 96 fL (ref 80–100)
Monocyte %: 10.3 %
Neutrophil #: 4.7 10*3/uL (ref 1.4–6.5)
Neutrophil %: 84.3 %
Platelet: 133 10*3/uL — ABNORMAL LOW (ref 150–440)

## 2011-11-06 LAB — MAGNESIUM: Magnesium: 2.2 mg/dL

## 2011-11-20 LAB — CBC WITH DIFFERENTIAL/PLATELET
Basophil #: 0 10*3/uL (ref 0.0–0.1)
Basophil %: 0 %
Eosinophil #: 0 10*3/uL (ref 0.0–0.7)
Eosinophil %: 0.5 %
HCT: 31.6 % — ABNORMAL LOW (ref 40.0–52.0)
HGB: 10.3 g/dL — ABNORMAL LOW (ref 13.0–18.0)
Lymphocyte %: 3.4 %
MCHC: 32.7 g/dL (ref 32.0–36.0)
MCV: 96 fL (ref 80–100)
Monocyte %: 9.1 %
Neutrophil #: 5 10*3/uL (ref 1.4–6.5)
Neutrophil %: 87 %
RBC: 3.27 10*6/uL — ABNORMAL LOW (ref 4.40–5.90)
RDW: 15.9 % — ABNORMAL HIGH (ref 11.5–14.5)

## 2011-11-20 LAB — BASIC METABOLIC PANEL
Anion Gap: 6 — ABNORMAL LOW (ref 7–16)
Calcium, Total: 9.2 mg/dL (ref 8.5–10.1)
Co2: 26 mmol/L (ref 21–32)
EGFR (African American): 13 — ABNORMAL LOW
EGFR (Non-African Amer.): 11 — ABNORMAL LOW
Glucose: 145 mg/dL — ABNORMAL HIGH (ref 65–99)
Osmolality: 310 (ref 275–301)
Potassium: 4.7 mmol/L (ref 3.5–5.1)
Sodium: 139 mmol/L (ref 136–145)

## 2011-11-20 LAB — MAGNESIUM: Magnesium: 2.8 mg/dL — ABNORMAL HIGH

## 2011-11-20 LAB — PHOSPHORUS: Phosphorus: 3.6 mg/dL (ref 2.5–4.9)

## 2011-11-30 ENCOUNTER — Other Ambulatory Visit: Payer: Self-pay

## 2011-12-04 LAB — CBC WITH DIFFERENTIAL/PLATELET
Basophil #: 0 10*3/uL (ref 0.0–0.1)
Basophil %: 0.3 %
Eosinophil #: 0 10*3/uL (ref 0.0–0.7)
HGB: 11 g/dL — ABNORMAL LOW (ref 13.0–18.0)
MCH: 32.2 pg (ref 26.0–34.0)
MCHC: 33.7 g/dL (ref 32.0–36.0)
MCV: 96 fL (ref 80–100)
Monocyte #: 0.7 x10 3/mm (ref 0.2–1.0)
Neutrophil #: 5.6 10*3/uL (ref 1.4–6.5)
Neutrophil %: 85.4 %
Platelet: 127 10*3/uL — ABNORMAL LOW (ref 150–440)
RBC: 3.4 10*6/uL — ABNORMAL LOW (ref 4.40–5.90)
WBC: 6.5 10*3/uL (ref 3.8–10.6)

## 2011-12-04 LAB — BASIC METABOLIC PANEL
Anion Gap: 6 — ABNORMAL LOW (ref 7–16)
Calcium, Total: 9.3 mg/dL (ref 8.5–10.1)
Co2: 28 mmol/L (ref 21–32)
EGFR (African American): 15 — ABNORMAL LOW
EGFR (Non-African Amer.): 13 — ABNORMAL LOW
Glucose: 187 mg/dL — ABNORMAL HIGH (ref 65–99)
Osmolality: 303 (ref 275–301)
Sodium: 140 mmol/L (ref 136–145)

## 2011-12-04 LAB — MAGNESIUM: Magnesium: 2.1 mg/dL

## 2011-12-31 ENCOUNTER — Other Ambulatory Visit: Payer: Self-pay

## 2012-01-02 LAB — CBC WITH DIFFERENTIAL/PLATELET
HCT: 36.9 % — ABNORMAL LOW (ref 40.0–52.0)
Lymphocyte #: 0.1 10*3/uL — ABNORMAL LOW (ref 1.0–3.6)
MCH: 32.1 pg (ref 26.0–34.0)
MCHC: 33.5 g/dL (ref 32.0–36.0)
MCV: 96 fL (ref 80–100)
Monocyte #: 0.6 x10 3/mm (ref 0.2–1.0)
Monocyte %: 7.5 %
Neutrophil #: 6.8 10*3/uL — ABNORMAL HIGH (ref 1.4–6.5)
Platelet: 158 10*3/uL (ref 150–440)
RBC: 3.85 10*6/uL — ABNORMAL LOW (ref 4.40–5.90)
RDW: 15.5 % — ABNORMAL HIGH (ref 11.5–14.5)
WBC: 7.7 10*3/uL (ref 3.8–10.6)

## 2012-01-02 LAB — BASIC METABOLIC PANEL
Anion Gap: 7 (ref 7–16)
Calcium, Total: 9.7 mg/dL (ref 8.5–10.1)
Co2: 27 mmol/L (ref 21–32)
Creatinine: 5.17 mg/dL — ABNORMAL HIGH (ref 0.60–1.30)
Potassium: 4.5 mmol/L (ref 3.5–5.1)
Sodium: 137 mmol/L (ref 136–145)

## 2012-01-02 LAB — PHOSPHORUS: Phosphorus: 2.7 mg/dL (ref 2.5–4.9)

## 2012-01-15 LAB — CBC WITH DIFFERENTIAL/PLATELET
Basophil #: 0 x10 3/mm 3 (ref 0.0–0.1)
Basophil %: 0.3 %
Eosinophil #: 0 x10 3/mm 3 (ref 0.0–0.7)
Eosinophil %: 0.3 %
HCT: 32.6 % — ABNORMAL LOW (ref 40.0–52.0)
HGB: 10.8 g/dL — ABNORMAL LOW (ref 13.0–18.0)
Lymphocyte %: 3.6 %
Lymphs Abs: 0.3 x10 3/mm 3 — ABNORMAL LOW (ref 1.0–3.6)
MCH: 31.5 pg (ref 26.0–34.0)
MCHC: 33 g/dL (ref 32.0–36.0)
MCV: 96 fL (ref 80–100)
Monocyte #: 0.6 x10 3/mm (ref 0.2–1.0)
Monocyte %: 8.2 %
Neutrophil #: 6.2 x10 3/mm 3 (ref 1.4–6.5)
Neutrophil %: 87.6 %
Platelet: 155 x10 3/mm 3 (ref 150–440)
RBC: 3.41 x10 6/mm 3 — ABNORMAL LOW (ref 4.40–5.90)
RDW: 15 % — ABNORMAL HIGH (ref 11.5–14.5)
WBC: 7 x10 3/mm 3 (ref 3.8–10.6)

## 2012-01-15 LAB — COMPREHENSIVE METABOLIC PANEL WITH GFR
Albumin: 3.5 g/dL (ref 3.4–5.0)
Alkaline Phosphatase: 115 U/L (ref 50–136)
Anion Gap: 9 (ref 7–16)
BUN: 89 mg/dL — ABNORMAL HIGH (ref 7–18)
Bilirubin,Total: 0.5 mg/dL (ref 0.2–1.0)
Calcium, Total: 9.3 mg/dL (ref 8.5–10.1)
Chloride: 106 mmol/L (ref 98–107)
Co2: 26 mmol/L (ref 21–32)
Creatinine: 4.94 mg/dL — ABNORMAL HIGH (ref 0.60–1.30)
EGFR (African American): 13 — ABNORMAL LOW
EGFR (Non-African Amer.): 12 — ABNORMAL LOW
Glucose: 186 mg/dL — ABNORMAL HIGH (ref 65–99)
Osmolality: 313 (ref 275–301)
Potassium: 4.8 mmol/L (ref 3.5–5.1)
SGOT(AST): 17 U/L (ref 15–37)
SGPT (ALT): 36 U/L (ref 12–78)
Sodium: 141 mmol/L (ref 136–145)
Total Protein: 7.3 g/dL (ref 6.4–8.2)

## 2012-01-15 LAB — PHOSPHORUS: Phosphorus: 3.1 mg/dL (ref 2.5–4.9)

## 2012-01-29 LAB — BASIC METABOLIC PANEL
Anion Gap: 9 (ref 7–16)
Calcium, Total: 8.7 mg/dL (ref 8.5–10.1)
EGFR (African American): 14 — ABNORMAL LOW
Glucose: 203 mg/dL — ABNORMAL HIGH (ref 65–99)
Sodium: 140 mmol/L (ref 136–145)

## 2012-01-29 LAB — CBC WITH DIFFERENTIAL/PLATELET
Basophil %: 0.3 %
Eosinophil #: 0 10*3/uL (ref 0.0–0.7)
Eosinophil %: 0.4 %
HCT: 32.6 % — ABNORMAL LOW (ref 40.0–52.0)
HGB: 10.9 g/dL — ABNORMAL LOW (ref 13.0–18.0)
Lymphocyte #: 0.1 10*3/uL — ABNORMAL LOW (ref 1.0–3.6)
Lymphocyte %: 2.1 %
MCH: 31.8 pg (ref 26.0–34.0)
MCHC: 33.3 g/dL (ref 32.0–36.0)
MCV: 95 fL (ref 80–100)
Monocyte %: 10 %
Neutrophil %: 87.2 %

## 2012-01-30 ENCOUNTER — Other Ambulatory Visit: Payer: Self-pay

## 2012-03-04 ENCOUNTER — Other Ambulatory Visit: Payer: Self-pay

## 2012-03-04 LAB — CBC WITH DIFFERENTIAL/PLATELET
Basophil #: 0 10*3/uL (ref 0.0–0.1)
Basophil %: 0.3 %
Eosinophil #: 0 10*3/uL (ref 0.0–0.7)
Eosinophil %: 0.4 %
HCT: 32.7 % — ABNORMAL LOW (ref 40.0–52.0)
Lymphocyte %: 2 %
MCH: 31.7 pg (ref 26.0–34.0)
MCV: 95 fL (ref 80–100)
Monocyte #: 0.5 x10 3/mm (ref 0.2–1.0)
Monocyte %: 7.6 %
Neutrophil %: 89.7 %
Platelet: 150 10*3/uL (ref 150–440)
RBC: 3.44 10*6/uL — ABNORMAL LOW (ref 4.40–5.90)
WBC: 6.4 10*3/uL (ref 3.8–10.6)

## 2012-03-04 LAB — BASIC METABOLIC PANEL
Anion Gap: 10 (ref 7–16)
BUN: 79 mg/dL — ABNORMAL HIGH (ref 7–18)
Calcium, Total: 8.7 mg/dL (ref 8.5–10.1)
Chloride: 107 mmol/L (ref 98–107)
EGFR (Non-African Amer.): 12 — ABNORMAL LOW
Glucose: 196 mg/dL — ABNORMAL HIGH (ref 65–99)
Osmolality: 310 (ref 275–301)

## 2012-03-04 LAB — PHOSPHORUS: Phosphorus: 3.4 mg/dL (ref 2.5–4.9)

## 2012-03-04 LAB — MAGNESIUM: Magnesium: 2.4 mg/dL

## 2012-03-31 ENCOUNTER — Other Ambulatory Visit: Payer: Self-pay

## 2012-04-01 LAB — COMPREHENSIVE METABOLIC PANEL
Albumin: 3.8 g/dL (ref 3.4–5.0)
Alkaline Phosphatase: 131 U/L (ref 50–136)
Anion Gap: 6 — ABNORMAL LOW (ref 7–16)
Calcium, Total: 9.1 mg/dL (ref 8.5–10.1)
Co2: 26 mmol/L (ref 21–32)
Creatinine: 4.69 mg/dL — ABNORMAL HIGH (ref 0.60–1.30)
EGFR (African American): 14 — ABNORMAL LOW
EGFR (Non-African Amer.): 12 — ABNORMAL LOW
Glucose: 170 mg/dL — ABNORMAL HIGH (ref 65–99)
Osmolality: 306 (ref 275–301)
SGOT(AST): 33 U/L (ref 15–37)
SGPT (ALT): 44 U/L (ref 12–78)
Total Protein: 7.1 g/dL (ref 6.4–8.2)

## 2012-04-01 LAB — CBC WITH DIFFERENTIAL/PLATELET
Basophil #: 0 10*3/uL (ref 0.0–0.1)
Eosinophil #: 0.1 10*3/uL (ref 0.0–0.7)
Eosinophil %: 1 %
HCT: 30 % — ABNORMAL LOW (ref 40.0–52.0)
HGB: 9.9 g/dL — ABNORMAL LOW (ref 13.0–18.0)
Lymphocyte #: 0.1 10*3/uL — ABNORMAL LOW (ref 1.0–3.6)
Lymphocyte %: 2 %
Neutrophil #: 5.9 10*3/uL (ref 1.4–6.5)
Neutrophil %: 84.5 %
RBC: 3.14 10*6/uL — ABNORMAL LOW (ref 4.40–5.90)
RDW: 15.7 % — ABNORMAL HIGH (ref 11.5–14.5)

## 2012-04-01 LAB — MAGNESIUM: Magnesium: 2.4 mg/dL

## 2012-05-01 ENCOUNTER — Other Ambulatory Visit: Payer: Self-pay

## 2012-05-06 LAB — BASIC METABOLIC PANEL
Anion Gap: 11 (ref 7–16)
EGFR (African American): 15 — ABNORMAL LOW
EGFR (Non-African Amer.): 13 — ABNORMAL LOW
Glucose: 282 mg/dL — ABNORMAL HIGH (ref 65–99)
Osmolality: 310 (ref 275–301)
Potassium: 4.4 mmol/L (ref 3.5–5.1)
Sodium: 140 mmol/L (ref 136–145)

## 2012-05-06 LAB — CBC WITH DIFFERENTIAL/PLATELET
Basophil #: 0.1 10*3/uL (ref 0.0–0.1)
Basophil %: 0.7 %
Eosinophil %: 0.6 %
HCT: 33.4 % — ABNORMAL LOW (ref 40.0–52.0)
Lymphocyte #: 0.1 10*3/uL — ABNORMAL LOW (ref 1.0–3.6)
MCH: 32 pg (ref 26.0–34.0)
Monocyte #: 0.6 x10 3/mm (ref 0.2–1.0)
Monocyte %: 8.2 %
Neutrophil #: 6.2 10*3/uL (ref 1.4–6.5)
Neutrophil %: 88.5 %
Platelet: 202 10*3/uL (ref 150–440)

## 2012-05-06 LAB — MAGNESIUM: Magnesium: 2.6 mg/dL — ABNORMAL HIGH

## 2012-05-06 LAB — PHOSPHORUS: Phosphorus: 3.4 mg/dL (ref 2.5–4.9)

## 2012-06-01 ENCOUNTER — Other Ambulatory Visit: Payer: Self-pay

## 2012-06-07 LAB — BASIC METABOLIC PANEL
Calcium, Total: 8.1 mg/dL — ABNORMAL LOW (ref 8.5–10.1)
Co2: 23 mmol/L (ref 21–32)
Creatinine: 4.31 mg/dL — ABNORMAL HIGH (ref 0.60–1.30)
EGFR (African American): 16 — ABNORMAL LOW
Osmolality: 310 (ref 275–301)
Potassium: 4.4 mmol/L (ref 3.5–5.1)
Sodium: 142 mmol/L (ref 136–145)

## 2012-06-07 LAB — CBC WITH DIFFERENTIAL/PLATELET
Basophil #: 0 10*3/uL (ref 0.0–0.1)
Basophil %: 0.6 %
Eosinophil #: 0.1 10*3/uL (ref 0.0–0.7)
Eosinophil %: 1 %
Lymphocyte #: 0.2 10*3/uL — ABNORMAL LOW (ref 1.0–3.6)
MCH: 32.1 pg (ref 26.0–34.0)
Monocyte #: 0.8 x10 3/mm (ref 0.2–1.0)
Monocyte %: 12.4 %
Neutrophil #: 5.1 10*3/uL (ref 1.4–6.5)
Neutrophil %: 83.6 %
Platelet: 159 10*3/uL (ref 150–440)
RBC: 3.2 10*6/uL — ABNORMAL LOW (ref 4.40–5.90)
WBC: 6.1 10*3/uL (ref 3.8–10.6)

## 2012-06-07 LAB — MAGNESIUM: Magnesium: 2.1 mg/dL

## 2012-06-07 LAB — PHOSPHORUS: Phosphorus: 2.8 mg/dL (ref 2.5–4.9)

## 2012-06-29 ENCOUNTER — Other Ambulatory Visit: Payer: Self-pay

## 2012-07-02 LAB — BASIC METABOLIC PANEL
BUN: 76 mg/dL — ABNORMAL HIGH (ref 7–18)
Calcium, Total: 8.3 mg/dL — ABNORMAL LOW (ref 8.5–10.1)
Chloride: 108 mmol/L — ABNORMAL HIGH (ref 98–107)
Co2: 25 mmol/L (ref 21–32)
Creatinine: 4.11 mg/dL — ABNORMAL HIGH (ref 0.60–1.30)
EGFR (African American): 17 — ABNORMAL LOW
Glucose: 264 mg/dL — ABNORMAL HIGH (ref 65–99)
Osmolality: 309 (ref 275–301)
Potassium: 4.7 mmol/L (ref 3.5–5.1)
Sodium: 139 mmol/L (ref 136–145)

## 2012-07-02 LAB — CBC WITH DIFFERENTIAL/PLATELET
Eosinophil %: 0.5 %
HGB: 11.5 g/dL — ABNORMAL LOW (ref 13.0–18.0)
Lymphocyte #: 0.1 10*3/uL — ABNORMAL LOW (ref 1.0–3.6)
Monocyte #: 0.8 x10 3/mm (ref 0.2–1.0)
Monocyte %: 10.5 %
Neutrophil #: 6.3 10*3/uL (ref 1.4–6.5)
Neutrophil %: 87.2 %
RDW: 14.8 % — ABNORMAL HIGH (ref 11.5–14.5)
WBC: 7.2 10*3/uL (ref 3.8–10.6)

## 2012-07-02 LAB — PHOSPHORUS: Phosphorus: 4 mg/dL (ref 2.5–4.9)

## 2012-07-02 LAB — MAGNESIUM: Magnesium: 2.3 mg/dL

## 2012-07-30 ENCOUNTER — Other Ambulatory Visit: Payer: Self-pay

## 2012-08-01 LAB — BASIC METABOLIC PANEL
BUN: 85 mg/dL — ABNORMAL HIGH (ref 7–18)
Calcium, Total: 9 mg/dL (ref 8.5–10.1)
Chloride: 109 mmol/L — ABNORMAL HIGH (ref 98–107)
Creatinine: 4.6 mg/dL — ABNORMAL HIGH (ref 0.60–1.30)
EGFR (African American): 15 — ABNORMAL LOW
EGFR (Non-African Amer.): 13 — ABNORMAL LOW
Glucose: 191 mg/dL — ABNORMAL HIGH (ref 65–99)
Osmolality: 309 (ref 275–301)
Potassium: 4.4 mmol/L (ref 3.5–5.1)

## 2012-08-01 LAB — PHOSPHORUS: Phosphorus: 3.2 mg/dL (ref 2.5–4.9)

## 2012-08-01 LAB — CBC WITH DIFFERENTIAL/PLATELET
Basophil #: 0 10*3/uL (ref 0.0–0.1)
Basophil %: 0.8 %
Eosinophil %: 1.2 %
HCT: 34.4 % — ABNORMAL LOW (ref 40.0–52.0)
HGB: 11.4 g/dL — ABNORMAL LOW (ref 13.0–18.0)
Lymphocyte %: 2.6 %
MCH: 31.5 pg (ref 26.0–34.0)
MCHC: 33 g/dL (ref 32.0–36.0)
MCV: 95 fL (ref 80–100)
Monocyte %: 12.9 %
Neutrophil %: 82.5 %
Platelet: 119 10*3/uL — ABNORMAL LOW (ref 150–440)
RBC: 3.61 10*6/uL — ABNORMAL LOW (ref 4.40–5.90)
WBC: 5.5 10*3/uL (ref 3.8–10.6)

## 2012-08-01 LAB — MAGNESIUM: Magnesium: 2.2 mg/dL

## 2012-08-29 ENCOUNTER — Other Ambulatory Visit: Payer: Self-pay

## 2012-10-01 ENCOUNTER — Other Ambulatory Visit: Payer: Self-pay | Admitting: Nephrology

## 2012-10-01 LAB — COMPREHENSIVE METABOLIC PANEL
Albumin: 3.5 g/dL (ref 3.4–5.0)
Anion Gap: 7 (ref 7–16)
Bilirubin,Total: 0.5 mg/dL (ref 0.2–1.0)
Chloride: 106 mmol/L (ref 98–107)
Co2: 25 mmol/L (ref 21–32)
Creatinine: 4.23 mg/dL — ABNORMAL HIGH (ref 0.60–1.30)
EGFR (African American): 16 — ABNORMAL LOW
Glucose: 170 mg/dL — ABNORMAL HIGH (ref 65–99)
Osmolality: 298 (ref 275–301)
Potassium: 4.7 mmol/L (ref 3.5–5.1)
SGPT (ALT): 17 U/L (ref 12–78)
Sodium: 138 mmol/L (ref 136–145)
Total Protein: 7.5 g/dL (ref 6.4–8.2)

## 2012-10-01 LAB — LIPID PANEL
HDL Cholesterol: 43 mg/dL (ref 40–60)
Ldl Cholesterol, Calc: 119 mg/dL — ABNORMAL HIGH (ref 0–100)
Triglycerides: 98 mg/dL (ref 0–200)
VLDL Cholesterol, Calc: 20 mg/dL (ref 5–40)

## 2012-10-01 LAB — CBC WITH DIFFERENTIAL/PLATELET
Basophil #: 0 10*3/uL (ref 0.0–0.1)
Eosinophil #: 0 10*3/uL (ref 0.0–0.7)
Eosinophil %: 0.4 %
HCT: 31.5 % — ABNORMAL LOW (ref 40.0–52.0)
HGB: 10.5 g/dL — ABNORMAL LOW (ref 13.0–18.0)
Lymphocyte #: 0.2 10*3/uL — ABNORMAL LOW (ref 1.0–3.6)
Lymphocyte %: 2.2 %
MCH: 31 pg (ref 26.0–34.0)
MCV: 93 fL (ref 80–100)
Monocyte #: 0.7 x10 3/mm (ref 0.2–1.0)
Platelet: 230 10*3/uL (ref 150–440)
RBC: 3.37 10*6/uL — ABNORMAL LOW (ref 4.40–5.90)

## 2012-10-29 ENCOUNTER — Other Ambulatory Visit: Payer: Self-pay | Admitting: Nephrology

## 2012-10-30 LAB — CBC WITH DIFFERENTIAL/PLATELET
Basophil %: 0.5 %
Eosinophil #: 0 10*3/uL (ref 0.0–0.7)
HGB: 10.3 g/dL — ABNORMAL LOW (ref 13.0–18.0)
MCH: 31.3 pg (ref 26.0–34.0)
MCV: 95 fL (ref 80–100)
Monocyte %: 11.8 %
Neutrophil %: 84.9 %
RBC: 3.28 10*6/uL — ABNORMAL LOW (ref 4.40–5.90)
RDW: 16.7 % — ABNORMAL HIGH (ref 11.5–14.5)
WBC: 6.6 10*3/uL (ref 3.8–10.6)

## 2012-10-30 LAB — PHOSPHORUS: Phosphorus: 4.4 mg/dL (ref 2.5–4.9)

## 2012-10-30 LAB — BASIC METABOLIC PANEL
Anion Gap: 10 (ref 7–16)
BUN: 80 mg/dL — ABNORMAL HIGH (ref 7–18)
Calcium, Total: 8.3 mg/dL — ABNORMAL LOW (ref 8.5–10.1)
Chloride: 102 mmol/L (ref 98–107)
Co2: 27 mmol/L (ref 21–32)
Glucose: 209 mg/dL — ABNORMAL HIGH (ref 65–99)
Osmolality: 308 (ref 275–301)
Sodium: 139 mmol/L (ref 136–145)

## 2012-11-29 ENCOUNTER — Other Ambulatory Visit: Payer: Self-pay | Admitting: Nephrology

## 2012-12-03 LAB — CBC WITH DIFFERENTIAL/PLATELET
Basophil #: 0 10*3/uL (ref 0.0–0.1)
Basophil %: 0.6 %
Eosinophil #: 0.1 10*3/uL (ref 0.0–0.7)
HCT: 30.6 % — ABNORMAL LOW (ref 40.0–52.0)
Lymphocyte #: 0.2 10*3/uL — ABNORMAL LOW (ref 1.0–3.6)
Lymphocyte %: 3.1 %
MCH: 32.3 pg (ref 26.0–34.0)
MCHC: 33.8 g/dL (ref 32.0–36.0)
MCV: 96 fL (ref 80–100)
Monocyte #: 0.6 x10 3/mm (ref 0.2–1.0)
Monocyte %: 10.6 %
Neutrophil #: 4.7 10*3/uL (ref 1.4–6.5)
Neutrophil %: 84.4 %
Platelet: 125 10*3/uL — ABNORMAL LOW (ref 150–440)
RBC: 3.2 10*6/uL — ABNORMAL LOW (ref 4.40–5.90)
RDW: 16.7 % — ABNORMAL HIGH (ref 11.5–14.5)
WBC: 5.6 10*3/uL (ref 3.8–10.6)

## 2012-12-03 LAB — BASIC METABOLIC PANEL
BUN: 86 mg/dL — ABNORMAL HIGH (ref 7–18)
Chloride: 108 mmol/L — ABNORMAL HIGH (ref 98–107)
Creatinine: 4.23 mg/dL — ABNORMAL HIGH (ref 0.60–1.30)
Glucose: 168 mg/dL — ABNORMAL HIGH (ref 65–99)
Sodium: 139 mmol/L (ref 136–145)

## 2012-12-03 LAB — PHOSPHORUS: Phosphorus: 3.3 mg/dL (ref 2.5–4.9)

## 2012-12-30 ENCOUNTER — Other Ambulatory Visit: Payer: Self-pay | Admitting: Nephrology

## 2013-02-05 ENCOUNTER — Other Ambulatory Visit: Payer: Self-pay | Admitting: Nephrology

## 2013-02-05 LAB — MAGNESIUM: Magnesium: 2.2 mg/dL

## 2013-02-05 LAB — BASIC METABOLIC PANEL
BUN: 87 mg/dL — ABNORMAL HIGH (ref 7–18)
Calcium, Total: 8 mg/dL — ABNORMAL LOW (ref 8.5–10.1)
Chloride: 107 mmol/L (ref 98–107)
Co2: 23 mmol/L (ref 21–32)
Creatinine: 4.42 mg/dL — ABNORMAL HIGH (ref 0.60–1.30)
EGFR (African American): 15 — ABNORMAL LOW
EGFR (Non-African Amer.): 13 — ABNORMAL LOW
Glucose: 144 mg/dL — ABNORMAL HIGH (ref 65–99)
Osmolality: 305 (ref 275–301)
Sodium: 138 mmol/L (ref 136–145)

## 2013-02-05 LAB — CBC WITH DIFFERENTIAL/PLATELET
Basophil #: 0 10*3/uL (ref 0.0–0.1)
Basophil %: 0.1 %
HCT: 31.4 % — ABNORMAL LOW (ref 40.0–52.0)
Lymphocyte #: 0.2 10*3/uL — ABNORMAL LOW (ref 1.0–3.6)
MCH: 32.6 pg (ref 26.0–34.0)
MCHC: 34 g/dL (ref 32.0–36.0)
Monocyte #: 0.8 x10 3/mm (ref 0.2–1.0)
Platelet: 140 10*3/uL — ABNORMAL LOW (ref 150–440)
RBC: 3.27 10*6/uL — ABNORMAL LOW (ref 4.40–5.90)
RDW: 15.8 % — ABNORMAL HIGH (ref 11.5–14.5)
WBC: 6.5 10*3/uL (ref 3.8–10.6)

## 2013-02-05 LAB — LIPID PANEL
Cholesterol: 197 mg/dL (ref 0–200)
Ldl Cholesterol, Calc: 135 mg/dL — ABNORMAL HIGH (ref 0–100)
VLDL Cholesterol, Calc: 9 mg/dL (ref 5–40)

## 2013-02-05 LAB — GAMMA GT: GGT: 95 U/L — ABNORMAL HIGH (ref 5–85)

## 2013-02-05 LAB — HEPATIC FUNCTION PANEL A (ARMC)
Albumin: 3.8 g/dL (ref 3.4–5.0)
Alkaline Phosphatase: 103 U/L (ref 50–136)
Bilirubin,Total: 0.5 mg/dL (ref 0.2–1.0)
SGPT (ALT): 19 U/L (ref 12–78)

## 2013-03-01 ENCOUNTER — Other Ambulatory Visit: Payer: Self-pay | Admitting: Nephrology

## 2013-03-04 LAB — BASIC METABOLIC PANEL
Anion Gap: 5 — ABNORMAL LOW (ref 7–16)
Calcium, Total: 8.6 mg/dL (ref 8.5–10.1)
Chloride: 108 mmol/L — ABNORMAL HIGH (ref 98–107)
Co2: 24 mmol/L (ref 21–32)
Creatinine: 4.84 mg/dL — ABNORMAL HIGH (ref 0.60–1.30)
EGFR (African American): 14 — ABNORMAL LOW
EGFR (Non-African Amer.): 12 — ABNORMAL LOW
Glucose: 151 mg/dL — ABNORMAL HIGH (ref 65–99)
Osmolality: 306 (ref 275–301)
Potassium: 5.3 mmol/L — ABNORMAL HIGH (ref 3.5–5.1)
Sodium: 137 mmol/L (ref 136–145)

## 2013-03-04 LAB — CBC WITH DIFFERENTIAL/PLATELET
Basophil %: 0.6 %
Eosinophil #: 0 10*3/uL (ref 0.0–0.7)
HCT: 31 % — ABNORMAL LOW (ref 40.0–52.0)
HGB: 10.6 g/dL — ABNORMAL LOW (ref 13.0–18.0)
Lymphocyte #: 0.1 10*3/uL — ABNORMAL LOW (ref 1.0–3.6)
Lymphocyte %: 2.1 %
MCHC: 34.1 g/dL (ref 32.0–36.0)
MCV: 95 fL (ref 80–100)
Monocyte %: 10.7 %
Neutrophil %: 86.1 %
RDW: 15.1 % — ABNORMAL HIGH (ref 11.5–14.5)
WBC: 5.9 10*3/uL (ref 3.8–10.6)

## 2013-03-04 LAB — MAGNESIUM: Magnesium: 2.5 mg/dL — ABNORMAL HIGH

## 2013-03-04 LAB — PHOSPHORUS: Phosphorus: 4.3 mg/dL (ref 2.5–4.9)

## 2013-03-31 ENCOUNTER — Other Ambulatory Visit: Payer: Self-pay | Admitting: Nephrology

## 2013-05-12 ENCOUNTER — Other Ambulatory Visit: Payer: Self-pay | Admitting: Nephrology

## 2013-05-12 LAB — BASIC METABOLIC PANEL
Anion Gap: 6 — ABNORMAL LOW (ref 7–16)
BUN: 77 mg/dL — AB (ref 7–18)
CALCIUM: 8.1 mg/dL — AB (ref 8.5–10.1)
Chloride: 106 mmol/L (ref 98–107)
Co2: 25 mmol/L (ref 21–32)
Creatinine: 4.36 mg/dL — ABNORMAL HIGH (ref 0.60–1.30)
EGFR (Non-African Amer.): 13 — ABNORMAL LOW
GFR CALC AF AMER: 15 — AB
Glucose: 149 mg/dL — ABNORMAL HIGH (ref 65–99)
Osmolality: 300 (ref 275–301)
POTASSIUM: 4.2 mmol/L (ref 3.5–5.1)
Sodium: 137 mmol/L (ref 136–145)

## 2013-05-12 LAB — HEPATIC FUNCTION PANEL A (ARMC)
ALK PHOS: 78 U/L
ALT: 17 U/L (ref 12–78)
Albumin: 3.2 g/dL — ABNORMAL LOW (ref 3.4–5.0)
BILIRUBIN DIRECT: 0.1 mg/dL (ref 0.00–0.20)
Bilirubin,Total: 0.4 mg/dL (ref 0.2–1.0)
SGOT(AST): 18 U/L (ref 15–37)
Total Protein: 7.7 g/dL (ref 6.4–8.2)

## 2013-05-12 LAB — CBC WITH DIFFERENTIAL/PLATELET
BASOS ABS: 0 10*3/uL (ref 0.0–0.1)
Basophil %: 0.4 %
EOS ABS: 0.1 10*3/uL (ref 0.0–0.7)
Eosinophil %: 1.6 %
HCT: 31.3 % — ABNORMAL LOW (ref 40.0–52.0)
HGB: 10.7 g/dL — AB (ref 13.0–18.0)
Lymphocyte #: 0.1 10*3/uL — ABNORMAL LOW (ref 1.0–3.6)
Lymphocyte %: 2.1 %
MCH: 32.8 pg (ref 26.0–34.0)
MCHC: 34.2 g/dL (ref 32.0–36.0)
MCV: 96 fL (ref 80–100)
Monocyte #: 1 x10 3/mm (ref 0.2–1.0)
Monocyte %: 15.2 %
Neutrophil #: 5.5 10*3/uL (ref 1.4–6.5)
Neutrophil %: 80.7 %
PLATELETS: 271 10*3/uL (ref 150–440)
RBC: 3.27 10*6/uL — AB (ref 4.40–5.90)
RDW: 15.9 % — ABNORMAL HIGH (ref 11.5–14.5)
WBC: 6.8 10*3/uL (ref 3.8–10.6)

## 2013-05-12 LAB — PHOSPHORUS: Phosphorus: 4.2 mg/dL (ref 2.5–4.9)

## 2013-05-12 LAB — LIPID PANEL
Cholesterol: 182 mg/dL (ref 0–200)
HDL: 36 mg/dL — AB (ref 40–60)
Ldl Cholesterol, Calc: 121 mg/dL — ABNORMAL HIGH (ref 0–100)
Triglycerides: 126 mg/dL (ref 0–200)
VLDL Cholesterol, Calc: 25 mg/dL (ref 5–40)

## 2013-05-12 LAB — MAGNESIUM: MAGNESIUM: 2.5 mg/dL — AB

## 2013-05-12 LAB — GAMMA GT: GGT: 61 U/L (ref 5–85)

## 2013-06-01 ENCOUNTER — Other Ambulatory Visit: Payer: Self-pay | Admitting: Nephrology

## 2013-06-02 LAB — CBC WITH DIFFERENTIAL/PLATELET
Basophil #: 0 10*3/uL (ref 0.0–0.1)
Basophil %: 0.5 %
EOS PCT: 1.5 %
Eosinophil #: 0.1 10*3/uL (ref 0.0–0.7)
HCT: 28.3 % — ABNORMAL LOW (ref 40.0–52.0)
HGB: 9.3 g/dL — AB (ref 13.0–18.0)
LYMPHS ABS: 0.1 10*3/uL — AB (ref 1.0–3.6)
Lymphocyte %: 2.5 %
MCH: 32.2 pg (ref 26.0–34.0)
MCHC: 33 g/dL (ref 32.0–36.0)
MCV: 98 fL (ref 80–100)
Monocyte #: 0.6 x10 3/mm (ref 0.2–1.0)
Monocyte %: 10.8 %
Neutrophil #: 5 10*3/uL (ref 1.4–6.5)
Neutrophil %: 84.7 %
Platelet: 112 10*3/uL — ABNORMAL LOW (ref 150–440)
RBC: 2.9 10*6/uL — ABNORMAL LOW (ref 4.40–5.90)
RDW: 16.9 % — ABNORMAL HIGH (ref 11.5–14.5)
WBC: 5.9 10*3/uL (ref 3.8–10.6)

## 2013-06-02 LAB — BASIC METABOLIC PANEL
Anion Gap: 5 — ABNORMAL LOW (ref 7–16)
BUN: 69 mg/dL — ABNORMAL HIGH (ref 7–18)
CO2: 25 mmol/L (ref 21–32)
Calcium, Total: 8.1 mg/dL — ABNORMAL LOW (ref 8.5–10.1)
Chloride: 108 mmol/L — ABNORMAL HIGH (ref 98–107)
Creatinine: 4.14 mg/dL — ABNORMAL HIGH (ref 0.60–1.30)
EGFR (African American): 16 — ABNORMAL LOW
EGFR (Non-African Amer.): 14 — ABNORMAL LOW
Glucose: 153 mg/dL — ABNORMAL HIGH (ref 65–99)
OSMOLALITY: 299 (ref 275–301)
POTASSIUM: 4.5 mmol/L (ref 3.5–5.1)
Sodium: 138 mmol/L (ref 136–145)

## 2013-06-02 LAB — PHOSPHORUS: Phosphorus: 4.5 mg/dL (ref 2.5–4.9)

## 2013-06-02 LAB — MAGNESIUM: Magnesium: 2.5 mg/dL — ABNORMAL HIGH

## 2013-06-29 ENCOUNTER — Other Ambulatory Visit: Payer: Self-pay | Admitting: Nephrology

## 2013-06-30 ENCOUNTER — Other Ambulatory Visit: Payer: Self-pay | Admitting: Internal Medicine

## 2013-06-30 LAB — CBC WITH DIFFERENTIAL/PLATELET
BASOS ABS: 0 10*3/uL (ref 0.0–0.1)
Basophil %: 0.7 %
Eosinophil #: 0.1 10*3/uL (ref 0.0–0.7)
Eosinophil %: 1.4 %
HCT: 30.3 % — ABNORMAL LOW (ref 40.0–52.0)
HGB: 10 g/dL — AB (ref 13.0–18.0)
LYMPHS ABS: 0.2 10*3/uL — AB (ref 1.0–3.6)
Lymphocyte %: 3.4 %
MCH: 32.4 pg (ref 26.0–34.0)
MCHC: 33 g/dL (ref 32.0–36.0)
MCV: 98 fL (ref 80–100)
Monocyte #: 0.5 x10 3/mm (ref 0.2–1.0)
Monocyte %: 10.6 %
NEUTROS PCT: 83.9 %
Neutrophil #: 4.2 10*3/uL (ref 1.4–6.5)
Platelet: 184 10*3/uL (ref 150–440)
RBC: 3.08 10*6/uL — ABNORMAL LOW (ref 4.40–5.90)
RDW: 15.9 % — ABNORMAL HIGH (ref 11.5–14.5)
WBC: 5 10*3/uL (ref 3.8–10.6)

## 2013-06-30 LAB — LIPID PANEL
CHOLESTEROL: 185 mg/dL (ref 0–200)
CHOLESTEROL: 187 mg/dL (ref 0–200)
HDL Cholesterol: 40 mg/dL (ref 40–60)
HDL: 38 mg/dL — AB (ref 40–60)
Ldl Cholesterol, Calc: 123 mg/dL — ABNORMAL HIGH (ref 0–100)
Ldl Cholesterol, Calc: 124 mg/dL — ABNORMAL HIGH (ref 0–100)
TRIGLYCERIDES: 118 mg/dL (ref 0–200)
Triglycerides: 117 mg/dL (ref 0–200)
VLDL Cholesterol, Calc: 23 mg/dL (ref 5–40)
VLDL Cholesterol, Calc: 24 mg/dL (ref 5–40)

## 2013-06-30 LAB — HEPATIC FUNCTION PANEL A (ARMC)
ALBUMIN: 3.6 g/dL (ref 3.4–5.0)
ALBUMIN: 3.6 g/dL (ref 3.4–5.0)
AST: 8 U/L — AB (ref 15–37)
Alkaline Phosphatase: 88 U/L
Alkaline Phosphatase: 91 U/L
BILIRUBIN DIRECT: 0.1 mg/dL (ref 0.00–0.20)
BILIRUBIN DIRECT: 0.1 mg/dL (ref 0.00–0.20)
BILIRUBIN TOTAL: 0.6 mg/dL (ref 0.2–1.0)
BILIRUBIN TOTAL: 0.6 mg/dL (ref 0.2–1.0)
SGOT(AST): 12 U/L — ABNORMAL LOW (ref 15–37)
SGPT (ALT): 10 U/L — ABNORMAL LOW (ref 12–78)
SGPT (ALT): 13 U/L (ref 12–78)
TOTAL PROTEIN: 7.6 g/dL (ref 6.4–8.2)
TOTAL PROTEIN: 7.7 g/dL (ref 6.4–8.2)

## 2013-06-30 LAB — BASIC METABOLIC PANEL
Anion Gap: 6 — ABNORMAL LOW (ref 7–16)
Anion Gap: 7 (ref 7–16)
BUN: 64 mg/dL — AB (ref 7–18)
BUN: 66 mg/dL — ABNORMAL HIGH (ref 7–18)
CALCIUM: 8.3 mg/dL — AB (ref 8.5–10.1)
CO2: 26 mmol/L (ref 21–32)
CREATININE: 3.87 mg/dL — AB (ref 0.60–1.30)
Calcium, Total: 8.2 mg/dL — ABNORMAL LOW (ref 8.5–10.1)
Chloride: 106 mmol/L (ref 98–107)
Chloride: 106 mmol/L (ref 98–107)
Co2: 27 mmol/L (ref 21–32)
Creatinine: 3.92 mg/dL — ABNORMAL HIGH (ref 0.60–1.30)
EGFR (African American): 18 — ABNORMAL LOW
EGFR (Non-African Amer.): 15 — ABNORMAL LOW
GFR CALC AF AMER: 17 — AB
GFR CALC NON AF AMER: 15 — AB
GLUCOSE: 105 mg/dL — AB (ref 65–99)
Glucose: 106 mg/dL — ABNORMAL HIGH (ref 65–99)
Osmolality: 296 (ref 275–301)
Osmolality: 297 (ref 275–301)
POTASSIUM: 4.2 mmol/L (ref 3.5–5.1)
Potassium: 4.2 mmol/L (ref 3.5–5.1)
SODIUM: 139 mmol/L (ref 136–145)
Sodium: 139 mmol/L (ref 136–145)

## 2013-06-30 LAB — PHOSPHORUS: Phosphorus: 4.4 mg/dL (ref 2.5–4.9)

## 2013-06-30 LAB — HEMOGLOBIN A1C: Hemoglobin A1C: 8.6 % — ABNORMAL HIGH (ref 4.2–6.3)

## 2013-06-30 LAB — MAGNESIUM: Magnesium: 2.5 mg/dL — ABNORMAL HIGH

## 2013-06-30 LAB — GAMMA GT: GGT: 87 U/L — AB (ref 5–85)

## 2013-07-30 ENCOUNTER — Other Ambulatory Visit: Payer: Self-pay | Admitting: Nephrology

## 2013-09-01 ENCOUNTER — Other Ambulatory Visit: Payer: Self-pay | Admitting: Nephrology

## 2013-09-01 LAB — CBC WITH DIFFERENTIAL/PLATELET
BASOS ABS: 0 10*3/uL (ref 0.0–0.1)
Basophil %: 0.5 %
Eosinophil #: 0.1 10*3/uL (ref 0.0–0.7)
Eosinophil %: 1.2 %
HCT: 30.2 % — ABNORMAL LOW (ref 40.0–52.0)
HGB: 9.9 g/dL — AB (ref 13.0–18.0)
Lymphocyte #: 0.2 10*3/uL — ABNORMAL LOW (ref 1.0–3.6)
Lymphocyte %: 3.4 %
MCH: 32.2 pg (ref 26.0–34.0)
MCHC: 32.7 g/dL (ref 32.0–36.0)
MCV: 99 fL (ref 80–100)
Monocyte #: 0.6 x10 3/mm (ref 0.2–1.0)
Monocyte %: 12.9 %
NEUTROS ABS: 3.8 10*3/uL (ref 1.4–6.5)
NEUTROS PCT: 82 %
Platelet: 172 10*3/uL (ref 150–440)
RBC: 3.06 10*6/uL — ABNORMAL LOW (ref 4.40–5.90)
RDW: 16.4 % — ABNORMAL HIGH (ref 11.5–14.5)
WBC: 4.6 10*3/uL (ref 3.8–10.6)

## 2013-09-01 LAB — BASIC METABOLIC PANEL
Anion Gap: 9 (ref 7–16)
BUN: 81 mg/dL — ABNORMAL HIGH (ref 7–18)
Calcium, Total: 8.2 mg/dL — ABNORMAL LOW (ref 8.5–10.1)
Chloride: 106 mmol/L (ref 98–107)
Co2: 24 mmol/L (ref 21–32)
Creatinine: 4.73 mg/dL — ABNORMAL HIGH (ref 0.60–1.30)
EGFR (African American): 14 — ABNORMAL LOW
GFR CALC NON AF AMER: 12 — AB
GLUCOSE: 108 mg/dL — AB (ref 65–99)
Osmolality: 302 (ref 275–301)
Potassium: 4.4 mmol/L (ref 3.5–5.1)
SODIUM: 139 mmol/L (ref 136–145)

## 2013-09-01 LAB — MAGNESIUM: Magnesium: 2.7 mg/dL — ABNORMAL HIGH

## 2013-09-01 LAB — PHOSPHORUS: PHOSPHORUS: 5.4 mg/dL — AB (ref 2.5–4.9)

## 2013-09-29 ENCOUNTER — Other Ambulatory Visit: Payer: Self-pay | Admitting: Nephrology

## 2013-10-01 LAB — LIPID PANEL
CHOLESTEROL: 189 mg/dL (ref 0–200)
HDL: 45 mg/dL (ref 40–60)
LDL CHOLESTEROL, CALC: 134 mg/dL — AB (ref 0–100)
Triglycerides: 50 mg/dL (ref 0–200)
VLDL Cholesterol, Calc: 10 mg/dL (ref 5–40)

## 2013-10-01 LAB — CBC WITH DIFFERENTIAL/PLATELET
BASOS ABS: 0 10*3/uL (ref 0.0–0.1)
Basophil %: 0.9 %
Eosinophil #: 0 10*3/uL (ref 0.0–0.7)
Eosinophil %: 0.9 %
HCT: 28.6 % — ABNORMAL LOW (ref 40.0–52.0)
HGB: 9.6 g/dL — ABNORMAL LOW (ref 13.0–18.0)
Lymphocyte #: 0.2 10*3/uL — ABNORMAL LOW (ref 1.0–3.6)
Lymphocyte %: 3.3 %
MCH: 32.7 pg (ref 26.0–34.0)
MCHC: 33.4 g/dL (ref 32.0–36.0)
MCV: 98 fL (ref 80–100)
MONOS PCT: 10.3 %
Monocyte #: 0.5 x10 3/mm (ref 0.2–1.0)
Neutrophil #: 4.5 10*3/uL (ref 1.4–6.5)
Neutrophil %: 84.6 %
Platelet: 158 10*3/uL (ref 150–440)
RBC: 2.92 10*6/uL — ABNORMAL LOW (ref 4.40–5.90)
RDW: 16.1 % — AB (ref 11.5–14.5)
WBC: 5.3 10*3/uL (ref 3.8–10.6)

## 2013-10-01 LAB — BASIC METABOLIC PANEL
ANION GAP: 5 — AB (ref 7–16)
BUN: 79 mg/dL — AB (ref 7–18)
Calcium, Total: 8 mg/dL — ABNORMAL LOW (ref 8.5–10.1)
Chloride: 110 mmol/L — ABNORMAL HIGH (ref 98–107)
Co2: 24 mmol/L (ref 21–32)
Creatinine: 4.23 mg/dL — ABNORMAL HIGH (ref 0.60–1.30)
EGFR (African American): 16 — ABNORMAL LOW
EGFR (Non-African Amer.): 14 — ABNORMAL LOW
Glucose: 108 mg/dL — ABNORMAL HIGH (ref 65–99)
Osmolality: 302 (ref 275–301)
POTASSIUM: 4.7 mmol/L (ref 3.5–5.1)
Sodium: 139 mmol/L (ref 136–145)

## 2013-10-01 LAB — PHOSPHORUS: PHOSPHORUS: 4.1 mg/dL (ref 2.5–4.9)

## 2013-10-01 LAB — HEPATIC FUNCTION PANEL A (ARMC)
ALT: 11 U/L — AB (ref 12–78)
AST: 15 U/L (ref 15–37)
Albumin: 3.7 g/dL (ref 3.4–5.0)
Alkaline Phosphatase: 83 U/L
BILIRUBIN TOTAL: 0.5 mg/dL (ref 0.2–1.0)
Bilirubin, Direct: 0.1 mg/dL (ref 0.00–0.20)
Total Protein: 7.5 g/dL (ref 6.4–8.2)

## 2013-10-01 LAB — MAGNESIUM: Magnesium: 2.7 mg/dL — ABNORMAL HIGH

## 2013-10-01 LAB — GAMMA GT: GGT: 61 U/L (ref 5–85)

## 2013-10-29 ENCOUNTER — Other Ambulatory Visit: Payer: Self-pay | Admitting: Nephrology

## 2013-10-31 LAB — CBC WITH DIFFERENTIAL/PLATELET
BASOS ABS: 0 10*3/uL (ref 0.0–0.1)
BASOS PCT: 0.6 %
Eosinophil #: 0.1 10*3/uL (ref 0.0–0.7)
Eosinophil %: 1.3 %
HCT: 28.1 % — AB (ref 40.0–52.0)
HGB: 9.3 g/dL — AB (ref 13.0–18.0)
LYMPHS PCT: 3.6 %
Lymphocyte #: 0.2 10*3/uL — ABNORMAL LOW (ref 1.0–3.6)
MCH: 32.2 pg (ref 26.0–34.0)
MCHC: 33.1 g/dL (ref 32.0–36.0)
MCV: 97 fL (ref 80–100)
Monocyte #: 0.5 x10 3/mm (ref 0.2–1.0)
Monocyte %: 10.2 %
NEUTROS PCT: 84.3 %
Neutrophil #: 4.4 10*3/uL (ref 1.4–6.5)
Platelet: 185 10*3/uL (ref 150–440)
RBC: 2.89 10*6/uL — ABNORMAL LOW (ref 4.40–5.90)
RDW: 15.9 % — AB (ref 11.5–14.5)
WBC: 5.3 10*3/uL (ref 3.8–10.6)

## 2013-10-31 LAB — PHOSPHORUS: Phosphorus: 4.5 mg/dL (ref 2.5–4.9)

## 2013-10-31 LAB — COMPREHENSIVE METABOLIC PANEL
ALT: 11 U/L — AB (ref 12–78)
ANION GAP: 9 (ref 7–16)
AST: 15 U/L (ref 15–37)
Albumin: 3.6 g/dL (ref 3.4–5.0)
Alkaline Phosphatase: 84 U/L
BUN: 75 mg/dL — AB (ref 7–18)
Bilirubin,Total: 0.5 mg/dL (ref 0.2–1.0)
CALCIUM: 7.8 mg/dL — AB (ref 8.5–10.1)
CHLORIDE: 107 mmol/L (ref 98–107)
CO2: 23 mmol/L (ref 21–32)
CREATININE: 4.37 mg/dL — AB (ref 0.60–1.30)
EGFR (Non-African Amer.): 13 — ABNORMAL LOW
GFR CALC AF AMER: 15 — AB
GLUCOSE: 106 mg/dL — AB (ref 65–99)
Osmolality: 300 (ref 275–301)
Potassium: 4.7 mmol/L (ref 3.5–5.1)
SODIUM: 139 mmol/L (ref 136–145)
TOTAL PROTEIN: 7.5 g/dL (ref 6.4–8.2)

## 2013-10-31 LAB — MAGNESIUM: Magnesium: 2.4 mg/dL

## 2013-11-29 ENCOUNTER — Other Ambulatory Visit: Payer: Self-pay | Admitting: Nephrology

## 2014-01-01 ENCOUNTER — Other Ambulatory Visit: Payer: Self-pay | Admitting: Nephrology

## 2014-01-01 LAB — CBC WITH DIFFERENTIAL/PLATELET
BASOS ABS: 0 10*3/uL (ref 0.0–0.1)
BASOS PCT: 0.5 %
EOS ABS: 0 10*3/uL (ref 0.0–0.7)
EOS PCT: 0.7 %
HCT: 31.9 % — ABNORMAL LOW (ref 40.0–52.0)
HGB: 10 g/dL — ABNORMAL LOW (ref 13.0–18.0)
Lymphocyte #: 0.3 10*3/uL — ABNORMAL LOW (ref 1.0–3.6)
Lymphocyte %: 5.3 %
MCH: 30.1 pg (ref 26.0–34.0)
MCHC: 31.3 g/dL — ABNORMAL LOW (ref 32.0–36.0)
MCV: 96 fL (ref 80–100)
MONOS PCT: 9.9 %
Monocyte #: 0.6 x10 3/mm (ref 0.2–1.0)
NEUTROS ABS: 4.8 10*3/uL (ref 1.4–6.5)
Neutrophil %: 83.6 %
Platelet: 216 10*3/uL (ref 150–440)
RBC: 3.31 10*6/uL — AB (ref 4.40–5.90)
RDW: 15.3 % — AB (ref 11.5–14.5)
WBC: 5.7 10*3/uL (ref 3.8–10.6)

## 2014-01-01 LAB — LIPID PANEL
Cholesterol: 237 mg/dL — ABNORMAL HIGH (ref 0–200)
HDL: 44 mg/dL (ref 40–60)
LDL CHOLESTEROL, CALC: 174 mg/dL — AB (ref 0–100)
TRIGLYCERIDES: 93 mg/dL (ref 0–200)
VLDL Cholesterol, Calc: 19 mg/dL (ref 5–40)

## 2014-01-01 LAB — HEPATIC FUNCTION PANEL A (ARMC)
ALK PHOS: 91 U/L
AST: 11 U/L — AB (ref 15–37)
Albumin: 3.3 g/dL — ABNORMAL LOW (ref 3.4–5.0)
BILIRUBIN TOTAL: 0.4 mg/dL (ref 0.2–1.0)
SGPT (ALT): 11 U/L — ABNORMAL LOW
Total Protein: 7.6 g/dL (ref 6.4–8.2)

## 2014-01-01 LAB — COMPREHENSIVE METABOLIC PANEL
ALT: 13 U/L — AB
Albumin: 3.3 g/dL — ABNORMAL LOW (ref 3.4–5.0)
Alkaline Phosphatase: 90 U/L
Anion Gap: 9 (ref 7–16)
BUN: 67 mg/dL — AB (ref 7–18)
Bilirubin,Total: 0.4 mg/dL (ref 0.2–1.0)
Calcium, Total: 8 mg/dL — ABNORMAL LOW (ref 8.5–10.1)
Chloride: 107 mmol/L (ref 98–107)
Co2: 24 mmol/L (ref 21–32)
Creatinine: 4.05 mg/dL — ABNORMAL HIGH (ref 0.60–1.30)
EGFR (Non-African Amer.): 14 — ABNORMAL LOW
GFR CALC AF AMER: 17 — AB
Glucose: 114 mg/dL — ABNORMAL HIGH (ref 65–99)
Osmolality: 300 (ref 275–301)
Potassium: 4.4 mmol/L (ref 3.5–5.1)
SGOT(AST): 17 U/L (ref 15–37)
Sodium: 140 mmol/L (ref 136–145)
TOTAL PROTEIN: 7.6 g/dL (ref 6.4–8.2)

## 2014-01-01 LAB — SGOT (AST)(ARMC): AST: 13 U/L — AB (ref 15–37)

## 2014-01-01 LAB — PHOSPHORUS: Phosphorus: 4.2 mg/dL (ref 2.5–4.9)

## 2014-01-01 LAB — GAMMA GT: GGT: 53 U/L (ref 5–85)

## 2014-01-01 LAB — MAGNESIUM: Magnesium: 2.4 mg/dL

## 2014-01-29 ENCOUNTER — Encounter: Payer: Self-pay | Admitting: Nephrology

## 2014-01-29 LAB — PHOSPHORUS: Phosphorus: 4.5 mg/dL (ref 2.5–4.9)

## 2014-01-29 LAB — CBC WITH DIFFERENTIAL/PLATELET
Basophil #: 0 10*3/uL (ref 0.0–0.1)
Basophil %: 0.1 %
EOS ABS: 0.1 10*3/uL (ref 0.0–0.7)
EOS PCT: 0.8 %
HCT: 36.3 % — AB (ref 40.0–52.0)
HGB: 11.6 g/dL — AB (ref 13.0–18.0)
LYMPHS ABS: 0.4 10*3/uL — AB (ref 1.0–3.6)
Lymphocyte %: 4.7 %
MCH: 30.3 pg (ref 26.0–34.0)
MCHC: 32.1 g/dL (ref 32.0–36.0)
MCV: 95 fL (ref 80–100)
Monocyte #: 0.7 x10 3/mm (ref 0.2–1.0)
Monocyte %: 8.9 %
NEUTROS PCT: 85.5 %
Neutrophil #: 7 10*3/uL — ABNORMAL HIGH (ref 1.4–6.5)
PLATELETS: 156 10*3/uL (ref 150–440)
RBC: 3.84 10*6/uL — AB (ref 4.40–5.90)
RDW: 14.9 % — AB (ref 11.5–14.5)
WBC: 8.2 10*3/uL (ref 3.8–10.6)

## 2014-01-29 LAB — COMPREHENSIVE METABOLIC PANEL
ALT: 19 U/L
AST: 17 U/L (ref 15–37)
Albumin: 3.7 g/dL (ref 3.4–5.0)
Alkaline Phosphatase: 102 U/L
Anion Gap: 8 (ref 7–16)
BILIRUBIN TOTAL: 0.6 mg/dL (ref 0.2–1.0)
BUN: 67 mg/dL — ABNORMAL HIGH (ref 7–18)
Calcium, Total: 7.8 mg/dL — ABNORMAL LOW (ref 8.5–10.1)
Chloride: 108 mmol/L — ABNORMAL HIGH (ref 98–107)
Co2: 25 mmol/L (ref 21–32)
Creatinine: 3.83 mg/dL — ABNORMAL HIGH (ref 0.60–1.30)
EGFR (African American): 21 — ABNORMAL LOW
EGFR (Non-African Amer.): 17 — ABNORMAL LOW
Glucose: 112 mg/dL — ABNORMAL HIGH (ref 65–99)
OSMOLALITY: 301 (ref 275–301)
POTASSIUM: 4.7 mmol/L (ref 3.5–5.1)
Sodium: 141 mmol/L (ref 136–145)
TOTAL PROTEIN: 7.8 g/dL (ref 6.4–8.2)

## 2014-01-29 LAB — MAGNESIUM: Magnesium: 2.5 mg/dL — ABNORMAL HIGH

## 2014-03-01 ENCOUNTER — Encounter: Payer: Self-pay | Admitting: Nephrology

## 2014-03-02 LAB — CBC WITH DIFFERENTIAL/PLATELET
Basophil #: 0 10*3/uL (ref 0.0–0.1)
Basophil %: 0.4 %
Eosinophil #: 0 10*3/uL (ref 0.0–0.7)
Eosinophil %: 0.5 %
HCT: 35.4 % — AB (ref 40.0–52.0)
HGB: 11.2 g/dL — ABNORMAL LOW (ref 13.0–18.0)
LYMPHS ABS: 0.4 10*3/uL — AB (ref 1.0–3.6)
Lymphocyte %: 5.3 %
MCH: 29.3 pg (ref 26.0–34.0)
MCHC: 31.8 g/dL — ABNORMAL LOW (ref 32.0–36.0)
MCV: 92 fL (ref 80–100)
MONO ABS: 0.6 x10 3/mm (ref 0.2–1.0)
MONOS PCT: 8.4 %
NEUTROS PCT: 85.4 %
Neutrophil #: 6 10*3/uL (ref 1.4–6.5)
PLATELETS: 198 10*3/uL (ref 150–440)
RBC: 3.84 10*6/uL — AB (ref 4.40–5.90)
RDW: 15.1 % — ABNORMAL HIGH (ref 11.5–14.5)
WBC: 7 10*3/uL (ref 3.8–10.6)

## 2014-03-02 LAB — BASIC METABOLIC PANEL
ANION GAP: 11 (ref 7–16)
BUN: 71 mg/dL — AB (ref 7–18)
CALCIUM: 7.9 mg/dL — AB (ref 8.5–10.1)
Chloride: 107 mmol/L (ref 98–107)
Co2: 24 mmol/L (ref 21–32)
Creatinine: 3.97 mg/dL — ABNORMAL HIGH (ref 0.60–1.30)
EGFR (African American): 20 — ABNORMAL LOW
EGFR (Non-African Amer.): 16 — ABNORMAL LOW
GLUCOSE: 100 mg/dL — AB (ref 65–99)
Osmolality: 304 (ref 275–301)
POTASSIUM: 4.1 mmol/L (ref 3.5–5.1)
SODIUM: 142 mmol/L (ref 136–145)

## 2014-03-02 LAB — PHOSPHORUS: Phosphorus: 4 mg/dL (ref 2.5–4.9)

## 2014-03-02 LAB — MAGNESIUM: Magnesium: 2.6 mg/dL — ABNORMAL HIGH

## 2014-03-31 ENCOUNTER — Encounter: Payer: Self-pay | Admitting: Nephrology

## 2014-04-16 LAB — CBC WITH DIFFERENTIAL/PLATELET
BASOS PCT: 0.5 %
Basophil #: 0 10*3/uL (ref 0.0–0.1)
EOS PCT: 0.6 %
Eosinophil #: 0.1 10*3/uL (ref 0.0–0.7)
HCT: 31.7 % — ABNORMAL LOW (ref 40.0–52.0)
HGB: 10 g/dL — AB (ref 13.0–18.0)
LYMPHS PCT: 4.7 %
Lymphocyte #: 0.4 10*3/uL — ABNORMAL LOW (ref 1.0–3.6)
MCH: 28.4 pg (ref 26.0–34.0)
MCHC: 31.7 g/dL — AB (ref 32.0–36.0)
MCV: 90 fL (ref 80–100)
MONOS PCT: 8.1 %
Monocyte #: 0.7 x10 3/mm (ref 0.2–1.0)
Neutrophil #: 7.4 10*3/uL — ABNORMAL HIGH (ref 1.4–6.5)
Neutrophil %: 86.1 %
Platelet: 137 10*3/uL — ABNORMAL LOW (ref 150–440)
RBC: 3.53 10*6/uL — ABNORMAL LOW (ref 4.40–5.90)
RDW: 15.8 % — ABNORMAL HIGH (ref 11.5–14.5)
WBC: 8.6 10*3/uL (ref 3.8–10.6)

## 2014-04-16 LAB — COMPREHENSIVE METABOLIC PANEL
ALBUMIN: 3.4 g/dL (ref 3.4–5.0)
ALT: 33 U/L
Alkaline Phosphatase: 136 U/L — ABNORMAL HIGH
Anion Gap: 10 (ref 7–16)
BUN: 64 mg/dL — AB (ref 7–18)
Bilirubin,Total: 0.5 mg/dL (ref 0.2–1.0)
CALCIUM: 7.6 mg/dL — AB (ref 8.5–10.1)
CREATININE: 3.71 mg/dL — AB (ref 0.60–1.30)
Chloride: 107 mmol/L (ref 98–107)
Co2: 24 mmol/L (ref 21–32)
EGFR (African American): 21 — ABNORMAL LOW
EGFR (Non-African Amer.): 18 — ABNORMAL LOW
GLUCOSE: 95 mg/dL (ref 65–99)
Osmolality: 299 (ref 275–301)
Potassium: 4.2 mmol/L (ref 3.5–5.1)
SGOT(AST): 20 U/L (ref 15–37)
Sodium: 141 mmol/L (ref 136–145)
Total Protein: 7.7 g/dL (ref 6.4–8.2)

## 2014-04-16 LAB — LIPID PANEL
Cholesterol: 202 mg/dL — ABNORMAL HIGH (ref 0–200)
HDL Cholesterol: 51 mg/dL (ref 40–60)
Ldl Cholesterol, Calc: 133 mg/dL — ABNORMAL HIGH (ref 0–100)
TRIGLYCERIDES: 91 mg/dL (ref 0–200)
VLDL CHOLESTEROL, CALC: 18 mg/dL (ref 5–40)

## 2014-04-16 LAB — MAGNESIUM: MAGNESIUM: 2.4 mg/dL

## 2014-04-16 LAB — PHOSPHORUS: Phosphorus: 4.1 mg/dL (ref 2.5–4.9)

## 2014-04-16 LAB — GAMMA GT: GGT: 192 U/L — ABNORMAL HIGH (ref 5–85)

## 2014-05-01 ENCOUNTER — Encounter: Payer: Self-pay | Admitting: Nephrology

## 2014-05-01 HISTORY — PX: CARPAL TUNNEL RELEASE: SHX101

## 2014-05-01 HISTORY — PX: GASTROSTOMY TUBE PLACEMENT: SHX655

## 2014-05-14 DIAGNOSIS — I639 Cerebral infarction, unspecified: Secondary | ICD-10-CM

## 2014-05-14 HISTORY — DX: Cerebral infarction, unspecified: I63.9

## 2014-08-10 ENCOUNTER — Other Ambulatory Visit
Admit: 2014-08-10 | Disposition: A | Discharge status: Home / Self Care | Payer: Self-pay | Attending: Nephrology | Admitting: Nephrology

## 2014-08-10 LAB — CBC WITH DIFFERENTIAL/PLATELET
Basophil #: 0.1 10*3/uL (ref 0.0–0.1)
Basophil %: 0.8 %
EOS ABS: 0.1 10*3/uL (ref 0.0–0.7)
Eosinophil %: 1 %
HCT: 27.3 % — AB (ref 40.0–52.0)
HGB: 8.6 g/dL — AB (ref 13.0–18.0)
Lymphocyte #: 0.3 10*3/uL — ABNORMAL LOW (ref 1.0–3.6)
Lymphocyte %: 3.1 %
MCH: 27.7 pg (ref 26.0–34.0)
MCHC: 31.5 g/dL — AB (ref 32.0–36.0)
MCV: 88 fL (ref 80–100)
MONO ABS: 0.8 x10 3/mm (ref 0.2–1.0)
Monocyte %: 9.4 %
Neutrophil #: 7.8 10*3/uL — ABNORMAL HIGH (ref 1.4–6.5)
Neutrophil %: 85.7 %
Platelet: 243 10*3/uL (ref 150–440)
RBC: 3.11 10*6/uL — ABNORMAL LOW (ref 4.40–5.90)
RDW: 18.5 % — AB (ref 11.5–14.5)
WBC: 9.1 10*3/uL (ref 3.8–10.6)

## 2014-08-10 LAB — BASIC METABOLIC PANEL
ANION GAP: 11 (ref 7–16)
BUN: 84 mg/dL — ABNORMAL HIGH
CALCIUM: 7.3 mg/dL — AB
CHLORIDE: 102 mmol/L
Co2: 22 mmol/L
Creatinine: 4.35 mg/dL — ABNORMAL HIGH
EGFR (African American): 15 — ABNORMAL LOW
EGFR (Non-African Amer.): 13 — ABNORMAL LOW
Glucose: 80 mg/dL
Potassium: 4.9 mmol/L
Sodium: 135 mmol/L

## 2014-08-10 LAB — MAGNESIUM: Magnesium: 2.2 mg/dL

## 2014-08-10 LAB — PHOSPHORUS: Phosphorus: 5.2 mg/dL — ABNORMAL HIGH

## 2014-10-07 DIAGNOSIS — M109 Gout, unspecified: Secondary | ICD-10-CM

## 2014-10-07 HISTORY — DX: Gout, unspecified: M10.9

## 2014-10-21 ENCOUNTER — Encounter
Admission: RE | Admit: 2014-10-21 | Discharge: 2014-10-21 | Disposition: A | Discharge status: Home / Self Care | Payer: Medicare Other | Source: Ambulatory Visit | Attending: Vascular Surgery | Admitting: Vascular Surgery

## 2014-10-21 ENCOUNTER — Ambulatory Visit
Admission: RE | Admit: 2014-10-21 | Discharge: 2014-10-21 | Disposition: A | Discharge status: Home / Self Care | Payer: Medicare Other | Source: Ambulatory Visit | Attending: Vascular Surgery | Admitting: Vascular Surgery

## 2014-10-21 DIAGNOSIS — Z952 Presence of prosthetic heart valve: Secondary | ICD-10-CM | POA: Insufficient documentation

## 2014-10-21 DIAGNOSIS — D649 Anemia, unspecified: Secondary | ICD-10-CM | POA: Insufficient documentation

## 2014-10-21 DIAGNOSIS — I69351 Hemiplegia and hemiparesis following cerebral infarction affecting right dominant side: Secondary | ICD-10-CM | POA: Diagnosis not present

## 2014-10-21 DIAGNOSIS — Z01811 Encounter for preprocedural respiratory examination: Secondary | ICD-10-CM | POA: Diagnosis present

## 2014-10-21 DIAGNOSIS — Z01812 Encounter for preprocedural laboratory examination: Secondary | ICD-10-CM | POA: Diagnosis present

## 2014-10-21 DIAGNOSIS — N189 Chronic kidney disease, unspecified: Secondary | ICD-10-CM | POA: Insufficient documentation

## 2014-10-21 DIAGNOSIS — I10 Essential (primary) hypertension: Secondary | ICD-10-CM | POA: Diagnosis not present

## 2014-10-21 DIAGNOSIS — E119 Type 2 diabetes mellitus without complications: Secondary | ICD-10-CM | POA: Diagnosis not present

## 2014-10-21 HISTORY — DX: Essential (primary) hypertension: I10

## 2014-10-21 HISTORY — DX: Gout, unspecified: M10.9

## 2014-10-21 HISTORY — DX: Cerebral infarction, unspecified: I63.9

## 2014-10-21 HISTORY — DX: Chronic kidney disease, unspecified: N18.9

## 2014-10-21 HISTORY — DX: Type 2 diabetes mellitus without complications: E11.9

## 2014-10-21 HISTORY — DX: Anemia, unspecified: D64.9

## 2014-10-21 LAB — CBC
HCT: 28 % — ABNORMAL LOW (ref 40.0–52.0)
Hemoglobin: 8.5 g/dL — ABNORMAL LOW (ref 13.0–18.0)
MCH: 26.1 pg (ref 26.0–34.0)
MCHC: 30.5 g/dL — AB (ref 32.0–36.0)
MCV: 85.7 fL (ref 80.0–100.0)
Platelets: 216 10*3/uL (ref 150–440)
RBC: 3.27 MIL/uL — AB (ref 4.40–5.90)
RDW: 21.9 % — ABNORMAL HIGH (ref 11.5–14.5)
WBC: 8.3 10*3/uL (ref 3.8–10.6)

## 2014-10-21 LAB — BASIC METABOLIC PANEL
Anion gap: 10 (ref 5–15)
BUN: 36 mg/dL — ABNORMAL HIGH (ref 6–20)
CHLORIDE: 96 mmol/L — AB (ref 101–111)
CO2: 31 mmol/L (ref 22–32)
Calcium: 7.9 mg/dL — ABNORMAL LOW (ref 8.9–10.3)
Creatinine, Ser: 3.71 mg/dL — ABNORMAL HIGH (ref 0.61–1.24)
GFR calc non Af Amer: 16 mL/min — ABNORMAL LOW (ref >60)
GFR, EST AFRICAN AMERICAN: 18 mL/min — AB (ref >60)
Glucose, Bld: 326 mg/dL — ABNORMAL HIGH (ref 65–99)
POTASSIUM: 3.5 mmol/L (ref 3.5–5.1)
Sodium: 137 mmol/L (ref 135–145)

## 2014-10-21 NOTE — Patient Instructions (Addendum)
  Your procedure is scheduled on: Friday October 30, 2014. Report to Same Day Surgery. To find out your arrival time please call [phone] between 1PM - 3PM on Thursday October 29, 2014.  Remember: Instructions that are not followed completely may result in serious medical risk, up to and including death, or upon the discretion of your surgeon and anesthesiologist your surgery may need to be rescheduled.    __x__ 1. Do not eat food or drink liquids after midnight. No gum chewing or hard candies.  (treat low blood sugar with 1/2 cup apple juice)   ____ 2. No Alcohol for 24 hours before or after surgery.   ____ 3. Bring all medications with you on the day of surgery if instructed.    __x__ 4. Notify your doctor if there is any change in your medical condition     (cold, fever, infections).     Do not wear jewelry, make-up, hairpins, clips or nail polish.  Do not wear lotions, powders, or perfumes. You may wear deodorant.  Do not shave 48 hours prior to surgery. Men may shave face and neck.  Do not bring valuables to the hospital.    [facility] is not responsible for any belongings or valuables.               Contacts, dentures or bridgework may not be worn into surgery.  Leave your suitcase in the car. After surgery it may be brought to your room.  For patients admitted to the hospital, discharge time is determined by your treatment team.   Patients discharged the day of surgery will not be allowed to drive home.    Please read over the following fact sheets that you were given:   [facility] Preparing for Surgery  __x__ Take these medicines the morning of surgery with A SIP OF WATER:    1. carvedilol (COREG)  2. hydrALAZINE (APRESOLINE  3. predniSONE (DELTASONE)   4. tacrolimus (PROGRAF)   ____ Fleet Enema (as directed)   __x__ Use CHG Soap as directed, SAGE wipes.  ____ Use inhalers on the day of surgery  ____ Stop metformin 2 days prior to surgery    _x___ Take 1/2 of  usual insulin dose the night before surgery and none on the morning of surgery. Take 7 units of Lantus at bedtime on October 29, 2014.  ____ Stop Coumadin/Plavix/aspirin on Does not apply.  ____ Stop Anti-inflammatories on does not apply.  May take Tylenol for pain.   ____ Stop supplements until after surgery.    _x__ Bring C-Pap to the hospital.

## 2014-10-23 NOTE — OR Nursing (Signed)
Medical clearance requested 10/22/14 for glu 326. Faxed to Dr Dareen Piano Per Dr Dareen Piano may have procedure ig fasting glucose down. Recheck am surgery ordered

## 2014-10-30 ENCOUNTER — Ambulatory Visit: Admission: RE | Admit: 2014-10-30 | Payer: Medicare Other | Source: Ambulatory Visit | Admitting: Vascular Surgery

## 2014-10-30 ENCOUNTER — Encounter: Admission: RE | Payer: Self-pay | Source: Ambulatory Visit

## 2014-10-30 SURGERY — ARTERIOVENOUS (AV) FISTULA CREATION
Anesthesia: General | Laterality: Right

## 2014-11-13 ENCOUNTER — Encounter
Admission: RE | Disposition: A | Discharge status: Home / Self Care | Payer: Self-pay | Source: Ambulatory Visit | Attending: Vascular Surgery

## 2014-11-13 ENCOUNTER — Ambulatory Visit: Payer: Medicare Other | Admitting: Anesthesiology

## 2014-11-13 ENCOUNTER — Ambulatory Visit
Admission: RE | Admit: 2014-11-13 | Discharge: 2014-11-13 | Disposition: A | Discharge status: Home / Self Care | Payer: Medicare Other | Source: Ambulatory Visit | Attending: Vascular Surgery | Admitting: Vascular Surgery

## 2014-11-13 DIAGNOSIS — Z794 Long term (current) use of insulin: Secondary | ICD-10-CM | POA: Diagnosis not present

## 2014-11-13 DIAGNOSIS — Z79899 Other long term (current) drug therapy: Secondary | ICD-10-CM | POA: Diagnosis not present

## 2014-11-13 DIAGNOSIS — I6930 Unspecified sequelae of cerebral infarction: Secondary | ICD-10-CM | POA: Diagnosis not present

## 2014-11-13 DIAGNOSIS — D649 Anemia, unspecified: Secondary | ICD-10-CM | POA: Insufficient documentation

## 2014-11-13 DIAGNOSIS — I120 Hypertensive chronic kidney disease with stage 5 chronic kidney disease or end stage renal disease: Secondary | ICD-10-CM | POA: Insufficient documentation

## 2014-11-13 DIAGNOSIS — Z87891 Personal history of nicotine dependence: Secondary | ICD-10-CM | POA: Diagnosis not present

## 2014-11-13 DIAGNOSIS — E669 Obesity, unspecified: Secondary | ICD-10-CM | POA: Diagnosis not present

## 2014-11-13 DIAGNOSIS — E1122 Type 2 diabetes mellitus with diabetic chronic kidney disease: Secondary | ICD-10-CM | POA: Insufficient documentation

## 2014-11-13 DIAGNOSIS — Z992 Dependence on renal dialysis: Secondary | ICD-10-CM | POA: Insufficient documentation

## 2014-11-13 DIAGNOSIS — M1990 Unspecified osteoarthritis, unspecified site: Secondary | ICD-10-CM | POA: Diagnosis not present

## 2014-11-13 DIAGNOSIS — N185 Chronic kidney disease, stage 5: Secondary | ICD-10-CM | POA: Diagnosis present

## 2014-11-13 DIAGNOSIS — Z7952 Long term (current) use of systemic steroids: Secondary | ICD-10-CM | POA: Diagnosis not present

## 2014-11-13 HISTORY — PX: AV FISTULA PLACEMENT: SHX1204

## 2014-11-13 LAB — GLUCOSE, CAPILLARY
Glucose-Capillary: 142 mg/dL — ABNORMAL HIGH (ref 65–99)
Glucose-Capillary: 159 mg/dL — ABNORMAL HIGH (ref 65–99)

## 2014-11-13 LAB — POTASSIUM: Potassium: 3.8

## 2014-11-13 SURGERY — ARTERIOVENOUS (AV) FISTULA CREATION
Anesthesia: General | Site: Arm Lower | Laterality: Right | Wound class: Clean

## 2014-11-13 MED ORDER — FAMOTIDINE 20 MG PO TABS
20.0000 mg | ORAL_TABLET | Freq: Once | ORAL | Status: AC
  Administered 2014-11-13: 20 mg via ORAL

## 2014-11-13 MED ORDER — PHENYLEPHRINE HCL 10 MG/ML IJ SOLN
INTRAMUSCULAR | Status: DC | PRN
  Administered 2014-11-13: 50 ug via INTRAVENOUS

## 2014-11-13 MED ORDER — HYDROMORPHONE HCL 1 MG/ML IJ SOLN: 0.2500 mg | INTRAMUSCULAR | Status: DC | PRN

## 2014-11-13 MED ORDER — FAMOTIDINE 20 MG PO TABS
ORAL_TABLET | ORAL | Status: AC
  Filled 2014-11-13: qty 1

## 2014-11-13 MED ORDER — HEPARIN SOD (PORK) LOCK FLUSH 10 UNIT/ML IV SOLN
INTRAVENOUS | Status: DC | PRN
  Administered 2014-11-13: 50 mL

## 2014-11-13 MED ORDER — PROPOFOL 10 MG/ML IV BOLUS
INTRAVENOUS | Status: DC | PRN
  Administered 2014-11-13: 120 mg via INTRAVENOUS

## 2014-11-13 MED ORDER — ONDANSETRON HCL 4 MG/2ML IJ SOLN: 4.0000 mg | Freq: Once | INTRAMUSCULAR | Status: DC | PRN

## 2014-11-13 MED ORDER — CEFAZOLIN SODIUM 1-5 GM-% IV SOLN
1.0000 g | Freq: Once | INTRAVENOUS | Status: AC
  Administered 2014-11-13: 1 g via INTRAVENOUS

## 2014-11-13 MED ORDER — MIDAZOLAM HCL 2 MG/2ML IJ SOLN
INTRAMUSCULAR | Status: DC | PRN
  Administered 2014-11-13: 2 mg via INTRAVENOUS

## 2014-11-13 MED ORDER — SODIUM CHLORIDE 0.9 % IV SOLN
INTRAVENOUS | Status: DC
  Administered 2014-11-13: 11:00:00 via INTRAVENOUS

## 2014-11-13 MED ORDER — CEFAZOLIN SODIUM 1-5 GM-% IV SOLN
INTRAVENOUS | Status: AC
  Administered 2014-11-13: 1 g via INTRAVENOUS
  Filled 2014-11-13: qty 50

## 2014-11-13 MED ORDER — FENTANYL CITRATE (PF) 100 MCG/2ML IJ SOLN
25.0000 ug | INTRAMUSCULAR | Status: DC | PRN
Start: 2014-11-13 — End: 2014-11-13

## 2014-11-13 MED ORDER — ONDANSETRON HCL 4 MG/2ML IJ SOLN
INTRAMUSCULAR | Status: DC | PRN
  Administered 2014-11-13: 4 mg via INTRAVENOUS

## 2014-11-13 MED ORDER — BUPIVACAINE HCL (PF) 0.5 % IJ SOLN
INTRAMUSCULAR | Status: AC
  Filled 2014-11-13: qty 30

## 2014-11-13 MED ORDER — SODIUM CHLORIDE 0.9 % IV SOLN
10000.0000 ug | INTRAVENOUS | Status: DC | PRN
  Administered 2014-11-13: 16:00:00 via INTRAVENOUS
  Administered 2014-11-13: 10 ug/min via INTRAVENOUS

## 2014-11-13 MED ORDER — PAPAVERINE HCL 30 MG/ML IJ SOLN
INTRAMUSCULAR | Status: AC
  Filled 2014-11-13: qty 2

## 2014-11-13 MED ORDER — FENTANYL CITRATE (PF) 100 MCG/2ML IJ SOLN
INTRAMUSCULAR | Status: DC | PRN
  Administered 2014-11-13 (×3): 50 ug via INTRAVENOUS

## 2014-11-13 MED ORDER — HEPARIN SODIUM (PORCINE) 5000 UNIT/ML IJ SOLN
INTRAMUSCULAR | Status: AC
  Filled 2014-11-13: qty 1

## 2014-11-13 MED ORDER — VASOPRESSIN 20 UNIT/ML IV SOLN
INTRAVENOUS | Status: DC | PRN
  Administered 2014-11-13: 2 [IU] via INTRAVENOUS

## 2014-11-13 SURGICAL SUPPLY — 52 items
APPLIER CLIP 11 MED OPEN (CLIP)
APPLIER CLIP 9.375 SM OPEN (CLIP)
BAG DECANTER STRL (MISCELLANEOUS) ×3 IMPLANT
BLADE SURG SZ11 CARB STEEL (BLADE) ×3 IMPLANT
BOOT SUTURE AID YELLOW STND (SUTURE) ×3 IMPLANT
BRUSH SCRUB 4% CHG (MISCELLANEOUS) ×3 IMPLANT
CANISTER SUCT 1200ML W/VALVE (MISCELLANEOUS) ×3 IMPLANT
CHLORAPREP W/TINT 26ML (MISCELLANEOUS) ×3 IMPLANT
CLIP APPLIE 11 MED OPEN (CLIP) IMPLANT
CLIP APPLIE 9.375 SM OPEN (CLIP) IMPLANT
ELECT CAUTERY BLADE 6.4 (BLADE) ×3 IMPLANT
GEL ULTRASOUND 20GR AQUASONIC (MISCELLANEOUS) IMPLANT
GLOVE SURG SYN 8.0 (GLOVE) ×3 IMPLANT
GOWN STRL REUS W/ TWL LRG LVL3 (GOWN DISPOSABLE) ×1 IMPLANT
GOWN STRL REUS W/ TWL XL LVL3 (GOWN DISPOSABLE) ×1 IMPLANT
GOWN STRL REUS W/TWL LRG LVL3 (GOWN DISPOSABLE) ×2
GOWN STRL REUS W/TWL XL LVL3 (GOWN DISPOSABLE) ×2
HEMOSTAT SURGICEL 2X3 (HEMOSTASIS) ×3 IMPLANT
IV NS 500ML (IV SOLUTION) ×2
IV NS 500ML BAXH (IV SOLUTION) ×1 IMPLANT
KIT RM TURNOVER STRD PROC AR (KITS) ×3 IMPLANT
LABEL OR SOLS (LABEL) ×3 IMPLANT
LIQUID BAND (GAUZE/BANDAGES/DRESSINGS) ×3 IMPLANT
LOOP RED MAXI  1X406MM (MISCELLANEOUS) ×2
LOOP VESSEL MAXI 1X406 RED (MISCELLANEOUS) ×1 IMPLANT
LOOP VESSEL MINI 0.8X406 BLUE (MISCELLANEOUS) ×2 IMPLANT
LOOPS BLUE MINI 0.8X406MM (MISCELLANEOUS) ×4
NEEDLE FILTER BLUNT 18X 1/2SAF (NEEDLE) ×2
NEEDLE FILTER BLUNT 18X1 1/2 (NEEDLE) ×1 IMPLANT
NEEDLE HYPO 30X.5 LL (NEEDLE) IMPLANT
NS IRRIG 500ML POUR BTL (IV SOLUTION) ×3 IMPLANT
PACK EXTREMITY ARMC (MISCELLANEOUS) ×3 IMPLANT
PAD GROUND ADULT SPLIT (MISCELLANEOUS) ×3 IMPLANT
PAD PREP 24X41 OB/GYN DISP (PERSONAL CARE ITEMS) ×3 IMPLANT
PUNCH SURGICAL ROTATE 2.7MM (MISCELLANEOUS) IMPLANT
STOCKINETTE STRL 4IN 9604848 (GAUZE/BANDAGES/DRESSINGS) ×3 IMPLANT
SUT MNCRL+ 5-0 UNDYED PC-3 (SUTURE) ×1 IMPLANT
SUT MONOCRYL 5-0 (SUTURE) ×2
SUT PROLENE 6 0 BV (SUTURE) ×12 IMPLANT
SUT PROLENE BV 1 BLUE 7-0 30IN (SUTURE) ×9 IMPLANT
SUT SILK 2 0 (SUTURE) ×2
SUT SILK 2 0 SH (SUTURE) ×3 IMPLANT
SUT SILK 2-0 18XBRD TIE 12 (SUTURE) ×1 IMPLANT
SUT SILK 3 0 (SUTURE) ×2
SUT SILK 3-0 18XBRD TIE 12 (SUTURE) ×1 IMPLANT
SUT SILK 4 0 (SUTURE) ×2
SUT SILK 4-0 18XBRD TIE 12 (SUTURE) ×1 IMPLANT
SUT VIC AB 3-0 SH 27 (SUTURE) ×2
SUT VIC AB 3-0 SH 27X BRD (SUTURE) ×1 IMPLANT
SYR 20CC LL (SYRINGE) ×3 IMPLANT
SYR 3ML LL SCALE MARK (SYRINGE) ×3 IMPLANT
TOWEL OR 17X26 4PK STRL BLUE (TOWEL DISPOSABLE) IMPLANT

## 2014-11-13 NOTE — Anesthesia Preprocedure Evaluation (Addendum)
Anesthesia Evaluation  Patient identified by MRN, date of birth, ID band Patient awake    Reviewed: Allergy & Precautions, NPO status , Patient's Chart, lab work & pertinent test results, reviewed documented beta blocker date and time   History of Anesthesia Complications Negative for: history of anesthetic complications  Airway Mallampati: II  TM Distance: >3 FB Neck ROM: Full    Dental  (+) Chipped,    Pulmonary neg pulmonary ROS,  breath sounds clear to auscultation  Pulmonary exam normal       Cardiovascular hypertension, Pt. on medications and Pt. on home beta blockers Normal cardiovascular examRhythm:Regular Rate:Normal     Neuro/Psych CVA, Residual Symptoms negative psych ROS   GI/Hepatic negative GI ROS, Neg liver ROS,   Endo/Other  negative endocrine ROSdiabetes, Well Controlled, Type 2, Insulin Dependent  Renal/GU CRF and ESRFRenal diseasenegative Renal ROS  negative genitourinary   Musculoskeletal  (+) Arthritis -,   Abdominal   Peds negative pediatric ROS (+)  Hematology negative hematology ROS (+) anemia ,   Anesthesia Other Findings   Reproductive/Obstetrics negative OB ROS                            Anesthesia Physical Anesthesia Plan  ASA: III  Anesthesia Plan: General   Post-op Pain Management:    Induction: Intravenous  Airway Management Planned: LMA  Additional Equipment:   Intra-op Plan:   Post-operative Plan: Extubation in OR  Informed Consent: I have reviewed the patients History and Physical, chart, labs and discussed the procedure including the risks, benefits and alternatives for the proposed anesthesia with the patient or authorized representative who has indicated his/her understanding and acceptance.   Dental advisory given  Plan Discussed with: CRNA and Surgeon  Anesthesia Plan Comments:         Anesthesia Quick Evaluation

## 2014-11-13 NOTE — H&P (Signed)
[facility] History & Physical Update  The patient was interviewed and re-examined.  The patient's previous History and Physical has been reviewed and is unchanged.  There is no change in the plan of care. We plan to proceed with the scheduled procedure.  Schnier, Latina CraverGregory G, MD  11/13/2014, 2:12 PM

## 2014-11-13 NOTE — Anesthesia Procedure Notes (Signed)
Procedure Name: LMA Insertion Date/Time: 11/13/2014 2:36 PM Performed by: Omer JackWEATHERLY, Geoffrey Rutten Pre-anesthesia Checklist: Patient identified, Patient being monitored, Timeout performed, Emergency Drugs available and Suction available Patient Re-evaluated:Patient Re-evaluated prior to inductionOxygen Delivery Method: Circle system utilized Preoxygenation: Pre-oxygenation with 100% oxygen Intubation Type: IV induction Ventilation: Mask ventilation without difficulty LMA: LMA inserted LMA Size: 4.5 Tube type: Oral Number of attempts: 1 Placement Confirmation: positive ETCO2 and breath sounds checked- equal and bilateral Tube secured with: Tape Dental Injury: Teeth and Oropharynx as per pre-operative assessment

## 2014-11-13 NOTE — Op Note (Signed)
    OPERATIVE NOTE   PROCEDURE: 1. right first stage basilic vein transposition (brachiobasilic arteriovenous fistula) placement  PRE-OPERATIVE DIAGNOSIS: Stage V renal insufficiency; hypertension  POST-OPERATIVE DIAGNOSIS: Same  SURGEON: Levora DredgeGregory Jehu Mccauslin, MD  ASSISTANT(S): Ms. Raul DelKim Stegmayer  ANESTHESIA: general  ESTIMATED BLOOD LOSS: 30 cc  FINDING(S): 1. 3.5 mm basilic vein  SPECIMEN(S):  None  INDICATIONS:   Geoffrey Stevens is a 69 y.o. male who presents with stage V renal insufficiency.  The patient is scheduled for right first stage basilic vein transposition.  The patient is aware the risks include but are not limited to: bleeding, infection, steal syndrome, nerve damage, ischemic monomelic neuropathy, failure to mature, and need for additional procedures.  The patient is aware of the risks of the procedure and elects to proceed forward.  DESCRIPTION: After full informed written consent was obtained from the patient, the patient was brought back to the operating room and placed supine upon the operating table.  Prior to induction, the patient received IV antibiotics.   After obtaining adequate anesthesia, the patient was then prepped and draped in the standard fashion for a right arm access procedure.  I turned my attention first to identifying the patient's basilic vein and brachial artery.   I made a linear incision at the level of the antecubital fossa and dissected through the subcutaneous tissue and fascia to gain exposure of the brachial artery.  This was noted to be suitable for fistula creation.  This was dissected out proximally and distally and controlled with vessel loops .  I then dissected out the basilic vein.  This was noted to be suitable for fistula creation. The patient was then heparinized.  The vein was marked for orientation and distal segment of the vein was ligated with a  2-0 silk, and the vein was transected.  I then instilled the heparinized saline into the  vein and clamped it.  At this point, I reset my exposure of the brachial artery and placed the artery under tension proximally and distally.  I made an arteriotomy with a #11 blade, and then I extended the arteriotomy with a Potts scissor.  I then irrigated the artery with heparinized saline.  The vein was cut and beveled to appropriate length to match the arteriotomy.  The vein was then sewn to the artery in an end-to-side configuration with a running 6-0 Prolene suture.  Prior to completing this anastomosis, I allowed the vein and artery to backbleed.  There was no evidence of clot from any vessels.  I completed the anastomosis in the usual fashion and then released all vessel loops and clamps.  There was palpable  thrill in the venous outflow, and there was a 1+ palpable radial pulse.  At this point, I irrigated out the surgical wound and placed Surgicel.  There was no further active bleeding.  The subcutaneous tissue was reapproximated with a running stitch of 3-0 Vicryl.  The skin was then reapproximated with a running subcuticular stitch of 4-0 Monocryl.  The skin was then cleaned, dried, and reinforced with Dermabond.  The patient tolerated this procedure well.   COMPLICATIONS: None  CONDITION: Geoffrey Stevens  Geoffrey Stevens  11/13/2014, 4:22 PM

## 2014-11-13 NOTE — Anesthesia Postprocedure Evaluation (Signed)
  Anesthesia Post-op Note  Patient: Geoffrey Stevens  Procedure(s) Performed: Procedure(s): ARTERIOVENOUS (AV) FISTULA CREATION (Right)  Anesthesia type:General  Patient location: PACU  Post pain: Pain level controlled  Post assessment: Post-op Vital signs reviewed, Patient's Cardiovascular Status Stable, Respiratory Function Stable, Patent Airway and No signs of Nausea or vomiting  Post vital signs: Reviewed and stable  Last Vitals:  Filed Vitals:   11/13/14 1728  BP: 104/63  Pulse: 65  Temp:   Resp: 16    Level of consciousness: awake, alert  and patient cooperative  Complications: No apparent anesthesia complications

## 2014-11-13 NOTE — Discharge Instructions (Addendum)
Follow Up:  August 01 3:45 PM Monday Apply dressing to site as needed Elevate arm to reduce swelling Pain medications as ordered AMBULATORY SURGERY  DISCHARGE INSTRUCTIONS   1) The drugs that you were given will stay in your system until tomorrow so for the next 24 hours you should not:  A) Drive an automobile B) Make any legal decisions C) Drink any alcoholic beverage   2) You may resume regular meals tomorrow.  Today it is better to start with liquids and gradually work up to solid foods.  You may eat anything you prefer, but it is better to start with liquids, then soup and crackers, and gradually work up to solid foods.   3) Please notify your doctor immediately if you have any unusual bleeding, trouble breathing, redness and pain at the surgery site, drainage, fever, or pain not relieved by medication.    4) Additional Instructions:        Please contact your physician with any problems or Same Day Surgery at [phone], Monday through Friday 6 am to 4 pm, or [place] [facility] Main number at [phone].

## 2014-11-13 NOTE — Transfer of Care (Signed)
Immediate Anesthesia Transfer of Care Note  Patient: Geoffrey DeltonJames H Hoffmeier  Procedure(s) Performed: Procedure(s): ARTERIOVENOUS (AV) FISTULA CREATION (Right)  Patient Location: PACU  Anesthesia Type:General  Level of Consciousness: sedated and responds to stimulation  Airway & Oxygen Therapy: Patient Spontanous Breathing and Patient connected to face mask oxygen  Post-op Assessment: Report given to RN and Post -op Vital signs reviewed and stable  Post vital signs: Reviewed and stable  Last Vitals:  Filed Vitals:   11/13/14 1633  BP: 153/100  Pulse: 65  Temp: 35.9 C  Resp: 10    Complications: No apparent anesthesia complications

## 2014-11-16 ENCOUNTER — Encounter: Payer: Self-pay | Admitting: Vascular Surgery

## 2015-01-12 ENCOUNTER — Other Ambulatory Visit: Payer: Self-pay | Admitting: Vascular Surgery

## 2015-01-13 ENCOUNTER — Ambulatory Visit
Admission: RE | Admit: 2015-01-13 | Discharge: 2015-01-13 | Disposition: A | Discharge status: Home / Self Care | Payer: Medicare Other | Source: Ambulatory Visit | Attending: Vascular Surgery | Admitting: Vascular Surgery

## 2015-01-13 ENCOUNTER — Encounter
Admission: RE | Admit: 2015-01-13 | Discharge: 2015-01-13 | Disposition: A | Discharge status: Home / Self Care | Payer: Medicare Other | Source: Ambulatory Visit | Attending: Vascular Surgery | Admitting: Vascular Surgery

## 2015-01-13 DIAGNOSIS — Z01818 Encounter for other preprocedural examination: Secondary | ICD-10-CM | POA: Insufficient documentation

## 2015-01-13 DIAGNOSIS — Z01812 Encounter for preprocedural laboratory examination: Secondary | ICD-10-CM | POA: Diagnosis present

## 2015-01-13 HISTORY — DX: Gout, unspecified: M10.9

## 2015-01-13 HISTORY — DX: Atherosclerotic heart disease of native coronary artery without angina pectoris: I25.10

## 2015-01-13 HISTORY — DX: Sleep apnea, unspecified: G47.30

## 2015-01-13 HISTORY — DX: Peripheral vascular disease, unspecified: I73.9

## 2015-01-13 LAB — PROTIME-INR
INR: 1.27
Prothrombin Time: 16.1 seconds — ABNORMAL HIGH (ref 11.4–15.0)

## 2015-01-13 LAB — ABO/RH: ABO/RH(D): O POS

## 2015-01-13 LAB — CBC WITH DIFFERENTIAL/PLATELET
BASOS PCT: 1 %
Basophils Absolute: 0.1 10*3/uL (ref 0–0.1)
Eosinophils Absolute: 0.1 10*3/uL (ref 0–0.7)
Eosinophils Relative: 2 %
HEMATOCRIT: 32.1 % — AB (ref 40.0–52.0)
Hemoglobin: 9.7 g/dL — ABNORMAL LOW (ref 13.0–18.0)
Lymphocytes Relative: 6 %
Lymphs Abs: 0.4 10*3/uL — ABNORMAL LOW (ref 1.0–3.6)
MCH: 24.2 pg — ABNORMAL LOW (ref 26.0–34.0)
MCHC: 30.3 g/dL — ABNORMAL LOW (ref 32.0–36.0)
MCV: 79.8 fL — AB (ref 80.0–100.0)
MONO ABS: 0.8 10*3/uL (ref 0.2–1.0)
Monocytes Relative: 11 %
Neutro Abs: 6.4 10*3/uL (ref 1.4–6.5)
Neutrophils Relative %: 82 %
PLATELETS: 221 10*3/uL (ref 150–440)
RBC: 4.02 MIL/uL — AB (ref 4.40–5.90)
RDW: 22.1 % — ABNORMAL HIGH (ref 11.5–14.5)
WBC: 7.8 10*3/uL (ref 3.8–10.6)

## 2015-01-13 LAB — SURGICAL PCR SCREEN
MRSA, PCR: NEGATIVE
Staphylococcus aureus: NEGATIVE

## 2015-01-13 LAB — TYPE AND SCREEN
ABO/RH(D): O POS
ANTIBODY SCREEN: NEGATIVE

## 2015-01-13 LAB — BASIC METABOLIC PANEL
Anion gap: 9 (ref 5–15)
BUN: 29 mg/dL — ABNORMAL HIGH (ref 6–20)
CALCIUM: 8.7 mg/dL — AB (ref 8.9–10.3)
CO2: 29 mmol/L (ref 22–32)
Chloride: 98 mmol/L — ABNORMAL LOW (ref 101–111)
Creatinine, Ser: 3.78 mg/dL — ABNORMAL HIGH (ref 0.61–1.24)
GFR, EST AFRICAN AMERICAN: 18 mL/min — AB (ref >60)
GFR, EST NON AFRICAN AMERICAN: 15 mL/min — AB (ref >60)
GLUCOSE: 92 mg/dL (ref 65–99)
POTASSIUM: 3.8 mmol/L (ref 3.5–5.1)
SODIUM: 136 mmol/L (ref 135–145)

## 2015-01-13 LAB — APTT: APTT: 41 s — AB (ref 24–36)

## 2015-01-13 NOTE — Patient Instructions (Signed)
  Your procedure is scheduled on: January 15, 2015 (Friday) Report to Day Surgery.[facility]) Second Floor To find out your arrival time please call [phone] between 1PM - 3PM on January 14, 2015 (Thursday).  Remember: Instructions that are not followed completely may result in serious medical risk, up to and including death, or upon the discretion of your surgeon and anesthesiologist your surgery may need to be rescheduled.    __x__ 1. Do not eat food or drink liquids after midnight. No gum chewing or hard candies.     ____ 2. No Alcohol for 24 hours before or after surgery.   ____ 3. Bring all medications with you on the day of surgery if instructed.    __x__ 4. Notify your doctor if there is any change in your medical condition     (cold, fever, infections).     Do not wear jewelry, make-up, hairpins, clips or nail polish.  Do not wear lotions, powders, or perfumes. You may wear deodorant.  Do not shave 48 hours prior to surgery. Men may shave face and neck.  Do not bring valuables to the hospital.    [facility] is not responsible for any belongings or valuables.               Contacts, dentures or bridgework may not be worn into surgery.  Leave your suitcase in the car. After surgery it may be brought to your room.  For patients admitted to the hospital, discharge time is determined by your                treatment team.   Patients discharged the day of surgery will not be allowed to drive home.   Please read over the following fact sheets that you were given:   MRSA Information and Surgical Site Infection Prevention   _x___ Take these medicines the morning of surgery with A SIP OF WATER:    1. Carvedilol  2. Prednisone  3.   4.  5.  6.  ____ Fleet Enema (as directed)   __x__ Use CHG Soap as directed (SAGE WIPES)  ____ Use inhalers on the day of surgery  ____ Stop metformin 2 days prior to surgery    _x___ Take 1/2 of usual insulin dose the night  before surgery and none on the morning of surgery. (ONE-HALF OF LANTUS INSULIN ON Thursday NIGHT, September 15)  ____ Stop Coumadin/Plavix/aspirin on   ____ Stop Anti-inflammatories on    ____ Stop supplements until after surgery.    __x__ Bring C-Pap to the hospital.

## 2015-01-14 NOTE — Pre-Procedure Instructions (Signed)
EKG and Labs sent to anesthesia for review

## 2015-01-15 ENCOUNTER — Ambulatory Visit
Admission: RE | Admit: 2015-01-15 | Discharge: 2015-01-15 | Disposition: A | Discharge status: Home / Self Care | Payer: Medicare Other | Source: Ambulatory Visit | Attending: Vascular Surgery | Admitting: Vascular Surgery

## 2015-01-15 ENCOUNTER — Encounter
Admission: RE | Disposition: A | Discharge status: Home / Self Care | Payer: Self-pay | Source: Ambulatory Visit | Attending: Vascular Surgery

## 2015-01-15 ENCOUNTER — Ambulatory Visit: Payer: Medicare Other | Admitting: Registered Nurse

## 2015-01-15 ENCOUNTER — Encounter: Payer: Self-pay | Admitting: Vascular Surgery

## 2015-01-15 DIAGNOSIS — Z992 Dependence on renal dialysis: Secondary | ICD-10-CM | POA: Diagnosis not present

## 2015-01-15 DIAGNOSIS — E892 Postprocedural hypoparathyroidism: Secondary | ICD-10-CM | POA: Insufficient documentation

## 2015-01-15 DIAGNOSIS — I1311 Hypertensive heart and chronic kidney disease without heart failure, with stage 5 chronic kidney disease, or end stage renal disease: Secondary | ICD-10-CM | POA: Diagnosis not present

## 2015-01-15 DIAGNOSIS — Z8673 Personal history of transient ischemic attack (TIA), and cerebral infarction without residual deficits: Secondary | ICD-10-CM | POA: Diagnosis not present

## 2015-01-15 DIAGNOSIS — D631 Anemia in chronic kidney disease: Secondary | ICD-10-CM | POA: Insufficient documentation

## 2015-01-15 DIAGNOSIS — M109 Gout, unspecified: Secondary | ICD-10-CM | POA: Diagnosis not present

## 2015-01-15 DIAGNOSIS — I739 Peripheral vascular disease, unspecified: Secondary | ICD-10-CM | POA: Insufficient documentation

## 2015-01-15 DIAGNOSIS — Z952 Presence of prosthetic heart valve: Secondary | ICD-10-CM | POA: Insufficient documentation

## 2015-01-15 DIAGNOSIS — I2510 Atherosclerotic heart disease of native coronary artery without angina pectoris: Secondary | ICD-10-CM | POA: Diagnosis not present

## 2015-01-15 DIAGNOSIS — E1022 Type 1 diabetes mellitus with diabetic chronic kidney disease: Secondary | ICD-10-CM | POA: Diagnosis not present

## 2015-01-15 DIAGNOSIS — Z794 Long term (current) use of insulin: Secondary | ICD-10-CM | POA: Insufficient documentation

## 2015-01-15 DIAGNOSIS — N186 End stage renal disease: Secondary | ICD-10-CM | POA: Diagnosis present

## 2015-01-15 DIAGNOSIS — Z940 Kidney transplant status: Secondary | ICD-10-CM | POA: Diagnosis not present

## 2015-01-15 DIAGNOSIS — N185 Chronic kidney disease, stage 5: Secondary | ICD-10-CM | POA: Insufficient documentation

## 2015-01-15 DIAGNOSIS — G4730 Sleep apnea, unspecified: Secondary | ICD-10-CM | POA: Insufficient documentation

## 2015-01-15 HISTORY — PX: BASCILIC VEIN TRANSPOSITION: SHX5742

## 2015-01-15 LAB — POCT I-STAT 4, (NA,K, GLUC, HGB,HCT)
Glucose, Bld: 108 mg/dL — ABNORMAL HIGH (ref 65–99)
HCT: 36 % — ABNORMAL LOW (ref 39.0–52.0)
Hemoglobin: 12.2 g/dL — ABNORMAL LOW (ref 13.0–17.0)
POTASSIUM: 3.5 mmol/L (ref 3.5–5.1)
SODIUM: 139 mmol/L (ref 135–145)

## 2015-01-15 LAB — GLUCOSE, CAPILLARY: Glucose-Capillary: 122 mg/dL — ABNORMAL HIGH (ref 65–99)

## 2015-01-15 LAB — POTASSIUM
GLUCOSE: 108
POTASSIUM, SERUM: 3.5

## 2015-01-15 SURGERY — TRANSPOSITION, VEIN, BASILIC
Anesthesia: General | Laterality: Right | Wound class: Clean

## 2015-01-15 MED ORDER — FAMOTIDINE 20 MG PO TABS
ORAL_TABLET | ORAL | Status: AC
  Filled 2015-01-15: qty 1

## 2015-01-15 MED ORDER — CEFAZOLIN SODIUM-DEXTROSE 2-3 GM-% IV SOLR
INTRAVENOUS | Status: AC
  Administered 2015-01-15: 2 g via INTRAVENOUS
  Filled 2015-01-15: qty 50

## 2015-01-15 MED ORDER — FAMOTIDINE 20 MG PO TABS
20.0000 mg | ORAL_TABLET | Freq: Once | ORAL | Status: AC
  Administered 2015-01-15: 20 mg via ORAL

## 2015-01-15 MED ORDER — OXYCODONE-ACETAMINOPHEN 5-325 MG PO TABS
ORAL_TABLET | ORAL | Status: AC
  Filled 2015-01-15: qty 1

## 2015-01-15 MED ORDER — CEFAZOLIN SODIUM-DEXTROSE 2-3 GM-% IV SOLR
2.0000 g | INTRAVENOUS | Status: AC
  Administered 2015-01-15: 2 g via INTRAVENOUS

## 2015-01-15 MED ORDER — PROPOFOL 10 MG/ML IV BOLUS
INTRAVENOUS | Status: DC | PRN
  Administered 2015-01-15: 100 mg via INTRAVENOUS

## 2015-01-15 MED ORDER — FENTANYL CITRATE (PF) 100 MCG/2ML IJ SOLN: 25.0000 ug | INTRAMUSCULAR | Status: DC | PRN

## 2015-01-15 MED ORDER — GLYCOPYRROLATE 0.2 MG/ML IJ SOLN
INTRAMUSCULAR | Status: DC | PRN
  Administered 2015-01-15: 0.2 mg via INTRAVENOUS

## 2015-01-15 MED ORDER — PHENYLEPHRINE HCL 10 MG/ML IJ SOLN
INTRAMUSCULAR | Status: DC | PRN
  Administered 2015-01-15: 100 ug via INTRAVENOUS
  Administered 2015-01-15: 200 ug via INTRAVENOUS
  Administered 2015-01-15 (×4): 100 ug via INTRAVENOUS

## 2015-01-15 MED ORDER — BUPIVACAINE HCL (PF) 0.5 % IJ SOLN
INTRAMUSCULAR | Status: AC
  Filled 2015-01-15: qty 30

## 2015-01-15 MED ORDER — HEPARIN SODIUM (PORCINE) 5000 UNIT/ML IJ SOLN
INTRAMUSCULAR | Status: AC
  Filled 2015-01-15: qty 1

## 2015-01-15 MED ORDER — MIDAZOLAM HCL 2 MG/2ML IJ SOLN
INTRAMUSCULAR | Status: DC | PRN
  Administered 2015-01-15: 2 mg via INTRAVENOUS

## 2015-01-15 MED ORDER — CARVEDILOL 25 MG PO TABS
25.0000 mg | ORAL_TABLET | Freq: Once | ORAL | Status: AC
  Administered 2015-01-15: 25 mg via ORAL

## 2015-01-15 MED ORDER — ONDANSETRON HCL 4 MG/2ML IJ SOLN
INTRAMUSCULAR | Status: DC | PRN
  Administered 2015-01-15: 4 mg via INTRAVENOUS

## 2015-01-15 MED ORDER — OXYCODONE-ACETAMINOPHEN 5-325 MG PO TABS: 1.0000 | ORAL_TABLET | Freq: Four times a day (QID) | ORAL | Status: AC | PRN

## 2015-01-15 MED ORDER — LIDOCAINE HCL (CARDIAC) 20 MG/ML IV SOLN
INTRAVENOUS | Status: DC | PRN
  Administered 2015-01-15: 100 mg via INTRAVENOUS

## 2015-01-15 MED ORDER — FENTANYL CITRATE (PF) 100 MCG/2ML IJ SOLN
INTRAMUSCULAR | Status: DC | PRN
  Administered 2015-01-15 (×2): 50 ug via INTRAVENOUS

## 2015-01-15 MED ORDER — CARVEDILOL 25 MG PO TABS
ORAL_TABLET | ORAL | Status: AC
  Administered 2015-01-15: 25 mg via ORAL
  Filled 2015-01-15: qty 1

## 2015-01-15 MED ORDER — ONDANSETRON HCL 4 MG/2ML IJ SOLN: 4.0000 mg | Freq: Once | INTRAMUSCULAR | Status: DC | PRN

## 2015-01-15 MED ORDER — OXYCODONE-ACETAMINOPHEN 5-325 MG PO TABS
1.0000 | ORAL_TABLET | Freq: Four times a day (QID) | ORAL | Status: DC | PRN
  Administered 2015-01-15: 1 via ORAL

## 2015-01-15 MED ORDER — DEXAMETHASONE SODIUM PHOSPHATE 4 MG/ML IJ SOLN
INTRAMUSCULAR | Status: DC | PRN
  Administered 2015-01-15: 10 mg via INTRAVENOUS

## 2015-01-15 MED ORDER — SODIUM CHLORIDE 0.9 % IV SOLN
INTRAVENOUS | Status: DC
  Administered 2015-01-15: 08:00:00 via INTRAVENOUS

## 2015-01-15 SURGICAL SUPPLY — 51 items
APPLIER CLIP 11 MED OPEN (CLIP)
APPLIER CLIP 9.375 SM OPEN (CLIP)
BAG DECANTER FOR FLEXI CONT (MISCELLANEOUS) ×3 IMPLANT
BLADE SURG 15 STRL LF DISP TIS (BLADE) ×1 IMPLANT
BLADE SURG 15 STRL SS (BLADE) ×2
BLADE SURG SZ11 CARB STEEL (BLADE) ×3 IMPLANT
BOOT SUTURE AID YELLOW STND (SUTURE) ×3 IMPLANT
BRUSH SCRUB 4% CHG (MISCELLANEOUS) ×3 IMPLANT
CANISTER SUCT 1200ML W/VALVE (MISCELLANEOUS) ×3 IMPLANT
CHLORAPREP W/TINT 26ML (MISCELLANEOUS) ×3 IMPLANT
CLIP APPLIE 11 MED OPEN (CLIP) IMPLANT
CLIP APPLIE 9.375 SM OPEN (CLIP) IMPLANT
DECANTER SPIKE VIAL GLASS SM (MISCELLANEOUS) ×3 IMPLANT
ELECT CAUTERY BLADE 6.4 (BLADE) ×3 IMPLANT
GEL ULTRASOUND 20GR AQUASONIC (MISCELLANEOUS) ×3 IMPLANT
GLOVE SURG SYN 8.0 (GLOVE) ×24 IMPLANT
GOWN L4 XLG 20 PK N/S (GOWN DISPOSABLE) ×3 IMPLANT
GOWN STRL REUS W/ TWL LRG LVL3 (GOWN DISPOSABLE) ×3 IMPLANT
GOWN STRL REUS W/TWL LRG LVL3 (GOWN DISPOSABLE) ×6
IV NS 500ML (IV SOLUTION) ×2
IV NS 500ML BAXH (IV SOLUTION) ×1 IMPLANT
KIT RM TURNOVER STRD PROC AR (KITS) ×3 IMPLANT
LABEL OR SOLS (LABEL) ×3 IMPLANT
LIQUID BAND (GAUZE/BANDAGES/DRESSINGS) ×6 IMPLANT
LOOP RED MAXI  1X406MM (MISCELLANEOUS) ×2
LOOP VESSEL MAXI 1X406 RED (MISCELLANEOUS) ×1 IMPLANT
LOOP VESSEL MINI 0.8X406 BLUE (MISCELLANEOUS) ×2 IMPLANT
LOOPS BLUE MINI 0.8X406MM (MISCELLANEOUS) ×4
NEEDLE FILTER BLUNT 18X 1/2SAF (NEEDLE) ×2
NEEDLE FILTER BLUNT 18X1 1/2 (NEEDLE) ×1 IMPLANT
PACK EXTREMITY ARMC (MISCELLANEOUS) ×3 IMPLANT
PAD GROUND ADULT SPLIT (MISCELLANEOUS) ×3 IMPLANT
PAD PREP 24X41 OB/GYN DISP (PERSONAL CARE ITEMS) ×3 IMPLANT
STOCKINETTE 4 (MISCELLANEOUS) ×3 IMPLANT
STOCKINETTE STRL 4IN 9604848 (GAUZE/BANDAGES/DRESSINGS) ×3 IMPLANT
SUT MNCRL+ 5-0 UNDYED PC-3 (SUTURE) ×1 IMPLANT
SUT MONOCRYL 5-0 (SUTURE) ×2
SUT PROLENE 5 0 RB 1 DA (SUTURE) ×3 IMPLANT
SUT PROLENE 6 0 BV (SUTURE) ×9 IMPLANT
SUT PROLENE 7 0 BV 1 (SUTURE) ×3 IMPLANT
SUT SILK 2 0 (SUTURE) ×2
SUT SILK 2 0 SH (SUTURE) ×6 IMPLANT
SUT SILK 2-0 18XBRD TIE 12 (SUTURE) ×1 IMPLANT
SUT SILK 3 0 (SUTURE) ×2
SUT SILK 3-0 18XBRD TIE 12 (SUTURE) ×1 IMPLANT
SUT SILK 4 0 (SUTURE) ×2
SUT SILK 4-0 18XBRD TIE 12 (SUTURE) ×1 IMPLANT
SUT VIC AB 3-0 SH 27 (SUTURE) ×6
SUT VIC AB 3-0 SH 27X BRD (SUTURE) ×3 IMPLANT
SYR 20CC LL (SYRINGE) ×3 IMPLANT
SYR 3ML LL SCALE MARK (SYRINGE) ×3 IMPLANT

## 2015-01-15 NOTE — Anesthesia Preprocedure Evaluation (Signed)
Anesthesia Evaluation    Airway Mallampati: II  TM Distance: >3 FB Neck ROM: Full    Dental  (+) Lower Dentures   Pulmonary sleep apnea (uses CPAP only occassionally) and Continuous Positive Airway Pressure Ventilation ,           Cardiovascular hypertension, Pt. on medications and Pt. on home beta blockers + CAD and + Peripheral Vascular Disease       Neuro/Psych CVA    GI/Hepatic   Endo/Other  diabetes, Insulin Dependent  Renal/GU      Musculoskeletal   Abdominal   Peds  Hematology  (+) anemia ,   Anesthesia Other Findings   Reproductive/Obstetrics                             Anesthesia Physical Anesthesia Plan  ASA: IV  Anesthesia Plan: General   Post-op Pain Management:    Induction: Intravenous  Airway Management Planned: LMA  Additional Equipment:   Intra-op Plan:   Post-operative Plan:   Informed Consent: I have reviewed the patients History and Physical, chart, labs and discussed the procedure including the risks, benefits and alternatives for the proposed anesthesia with the patient or authorized representative who has indicated his/her understanding and acceptance.     Plan Discussed with:   Anesthesia Plan Comments:         Anesthesia Quick Evaluation

## 2015-01-15 NOTE — Anesthesia Procedure Notes (Signed)
Procedure Name: LMA Insertion Date/Time: 01/15/2015 10:45 AM Performed by: Stormy Fabian Pre-anesthesia Checklist: Patient identified, Patient being monitored, Timeout performed, Emergency Drugs available and Suction available Patient Re-evaluated:Patient Re-evaluated prior to inductionOxygen Delivery Method: Circle system utilized Preoxygenation: Pre-oxygenation with 100% oxygen Intubation Type: IV induction Ventilation: Mask ventilation without difficulty LMA: LMA inserted LMA Size: 3.5 Tube type: Oral Number of attempts: 1 Placement Confirmation: positive ETCO2 and breath sounds checked- equal and bilateral Tube secured with: Tape Dental Injury: Teeth and Oropharynx as per pre-operative assessment

## 2015-01-15 NOTE — Anesthesia Postprocedure Evaluation (Signed)
  Anesthesia Post-op Note  Patient: Geoffrey Stevens  Procedure(s) Performed: Procedure(s): BASCILIC VEIN TRANSPOSITION (Right)  Anesthesia type:General  Patient location: PACU  Post pain: Pain level controlled  Post assessment: Post-op Vital signs reviewed, Patient's Cardiovascular Status Stable, Respiratory Function Stable, Patent Airway and No signs of Nausea or vomiting  Post vital signs: Reviewed and stable  Last Vitals:  Filed Vitals:   01/15/15 1421  BP: 125/73  Pulse:   Temp:   Resp:     Level of consciousness: awake, alert  and patient cooperative  Complications: No apparent anesthesia complications

## 2015-01-15 NOTE — Op Note (Signed)
OPERATIVE NOTE   PROCEDURE: left  basilic vein transposition (brachiobasilic arteriovenous fistula) placement  PRE-OPERATIVE DIAGNOSIS: Stage V renal insufficiency  POST-OPERATIVE DIAGNOSIS: Same  SURGEON: Renford Dills, M.D.  ASSISTANT(S): Ms. Raul Del  ANESTHESIA: general  ESTIMATED BLOOD LOSS: Minimal cc  FINDING(S): Basilic vein of good caliber  SPECIMEN(S):  None  INDICATIONS:   [name] is a 69 y.o. male who presents with stage V renal insufficiency. She is therefore undergoing creation of a fistula so that he will have adequate dialysis access in the future and avoid catheter placement..  The patient is scheduled for right second stage basilic vein transposition.  The patient is aware the risks include but are not limited to: bleeding, infection, steal syndrome, nerve damage, failure to mature, and need for additional procedures.  The patient is aware of the risks of the procedure and elects to proceed forward.  DESCRIPTION: After full informed written consent was obtained from the patient, the patient was brought back to the operating room and placed supine upon the operating table.  Prior to induction, the patient received IV antibiotics.   After obtaining adequate anesthesia, the patient's right arm was then prepped and draped in the standard fashion. Appropriate timeout is called  The previous fistula anastomosis is localized by palpation and a linear incision is made beginning at the antecubital fossa overlying the anastomosis and extending it more proximally up the arm.  I made this longitudinal incision over the vein from the level of the antecubital fossa up to its axillary extent.  I carefully dissected the vein away from its adjacent nerves.  The entirety of the basilic vein was mobilized and the vein was then marked with a surgical marker to maintain appropriate orientation. The anastomosis was then ligated with a 5-0 Prolene and the vein was divided.  It was then irrigated with heparinized saline.   A plane on top of the biceps fascia was dissected with electrocautery and the Gore tunneler. The vein was then approximated to the skin in order to plan for the position of the arterial anastomosis.  The deep subcutaneous tissue was inspected for bleeding.  The basilic vein was then pulled subcutaneously using a Gore tunneler.  The brachial artery is then dissected and looped with Silastic Vessel loops. Brachial artery is then controlled with the vessel loops and arteriotomy is made with an 11 blade and extended with Potts scissors and 6-0 Prolene stay sutures were placed. End vein to side brachial artery anastomosis is then fashioned with running 6-0 Prolene. Flushing maneuvers performed and flow was established through the fistula. Excellent thrill is noted within the vein 2+ radial pulses maintained at the wrist.   Bleeding was controlled with electrocautery and placement of large pieces of thrombin and gelfoam.  I washed out the surgical site after waiting a few minutes, and there was no further bleeding.  The fascia was reapproximated with interrupted stitches of 3-0 Vicryl to eliminate some of the deep space.  The superficial subcutaneous tissue was then reapproximated along the incision line with a running stitch of 3-0 Vicryl.  The skin was then reapproximated with a running subcuticular of 4-0 Monocryl.  The skin was then cleaned, dried, and reinforced with Dermabond.    The patient tolerated this procedure well.   COMPLICATIONS: None  CONDITION: Geoffrey Stevens  01/15/2015, 12:46 PM

## 2015-01-15 NOTE — Discharge Instructions (Signed)

## 2015-01-15 NOTE — H&P (Signed)
[facility] Admission History & Physical  MRN : [id number]  Geoffrey Stevens is a 69 y.o. ([birth year]) male who presents with chief complaint of No chief complaint on file. Marland Kitchen  History of Present Illness: Patient presents with end-stage renal disease and is on hemodialysis. He has had a right arm brachial basilic fistula created. The vein has matured nicely and is of appropriate size therefore he is undergoing transposition of the basilic vein so that he will have adequate AV access.  Current Facility-Administered Medications  Medication Dose Route Frequency Provider Last Rate Last Dose  . 0.9 %  sodium chloride infusion   Intravenous Continuous Yves Dill, MD 50 mL/hr at 01/15/15 [phone]    . ceFAZolin (ANCEF) 2-3 GM-% IVPB SOLR           . ceFAZolin (ANCEF) IVPB 2 g/50 mL premix  2 g Intravenous On Call to OR Cala Bradford A Stegmayer, PA-C      . famotidine (PEPCID) 20 MG tablet             Past Medical History  Diagnosis Date  . Diabetes mellitus without complication   . Gout of right wrist 10/07/14  . Hypertension   . Stroke 05/14/2014    right side of body weakness, can walk with a walker.  . Anemia   . Peripheral vascular disease   . Sleep apnea     C-PAP  . Chronic kidney disease 2012    Left Kidney Transplant  . Gout   . Coronary artery disease     Past Surgical History  Procedure Laterality Date  . Aortic valve replacement N/A 2011  . Parathyroidectomy  2010  . Kidney transplant Left 2012  . Carpal tunnel release Left 2016  . Gastrostomy tube placement  05/2014    has been removed  . Av fistula placement Right 11/13/2014    Procedure: ARTERIOVENOUS (AV) FISTULA CREATION;  Surgeon: Renford Dills, MD;  Location: [facility] ORS;  Service: Vascular;  Laterality: Right;  . Ij catheter      Social History Social History  Substance Use Topics  . Smoking status: Never Smoker   . Smokeless tobacco: Not on file  . Alcohol Use: No    Family  History No family history on file. no family history of porphyria, autoimmune disease or bleeding/clotting disorders  No Known Allergies   REVIEW OF SYSTEMS (Negative unless checked)  Constitutional: [] Weight loss  [] Fever  [] Chills Cardiac: [] Chest pain   [] Chest pressure   [] Palpitations   [] Shortness of breath when laying flat   [] Shortness of breath at rest   [] Shortness of breath with exertion. Vascular:  [] Pain in legs with walking   [] Pain in legs at rest   [] Pain in legs when laying flat   [] Claudication   [] Pain in feet when walking  [] Pain in feet at rest  [] Pain in feet when laying flat   [] History of DVT   [] Phlebitis   [] Swelling in legs   [] Varicose veins   [] Non-healing ulcers Pulmonary:   [] Uses home oxygen   [] Productive cough   [] Hemoptysis   [] Wheeze  [] COPD   [] Asthma Neurologic:  [] Dizziness  [] Blackouts   [] Seizures   [] History of stroke   [] History of TIA  [] Aphasia   [] Temporary blindness   [] Dysphagia   [] Weakness or numbness in arms   [] Weakness or numbness in legs Musculoskeletal:  [] Arthritis   [] Joint swelling   [] Joint pain   [] Low back pain Hematologic:  []   Easy bruising  [] Easy bleeding   [] Hypercoagulable state   [] Anemic  [] Hepatitis Gastrointestinal:  [] Blood in stool   [] Vomiting blood  [] Gastroesophageal reflux/heartburn   [] Difficulty swallowing. Genitourinary:  [] Chronic kidney disease   [] Difficult urination  [] Frequent urination  [] Burning with urination   [] Blood in urine Skin:  [] Rashes   [] Ulcers   [] Wounds Psychological:  [] History of anxiety   []  History of major depression.  Physical Examination  Filed Vitals:   01/15/15 0756  BP: 122/67  Temp: 98.7 F (37.1 C)  TempSrc: Oral  Resp: 160  SpO2: 96%   There is no weight on file to calculate BMI. Gen: WD/WN, NAD Head: [place]/AT, No temporalis wasting.  Ear/Nose/Throat: Hearing grossly intact, nares w/o erythema or drainage, oropharynx w/o Erythema/Exudate, Eyes: PERRLA, EOMI.  Neck: Supple,  no nuchal rigidity.  No bruit or JVD.  Pulmonary:  Good air movement, clear to auscultation bilaterally, no increased work of respiration or use of accessory muscles  Cardiac: RRR, normal S1, S2, no Murmurs, rubs or gallops. Vascular: Right arm well-healed incision excellent thrill excellent bruit just proximal to the antecubital fossa Vessel Right Left  Radial Palpable Palpable  Ulnar Palpable Palpable  Brachial Palpable Palpable  Carotid Palpable, without bruit Palpable, without bruit  Aorta Not palpable N/A  Femoral Palpable Palpable  Popliteal Palpable Palpable  PT Palpable Palpable  DP Palpable Palpable   Gastrointestinal: soft, non-tender/non-distended. No guarding/reflex. No masses, surgical incisions, or scars. Musculoskeletal: M/S 5/5 throughout.  No deformity or atrophy. Neurologic: CN 2-12 intact. Pain and light touch intact in extremities.  Symmetrical.  Speech is fluent. Motor exam as listed above. Psychiatric: Judgment intact, Mood & affect appropriate for pt's clinical situation. Dermatologic: No rashes or ulcers noted.  No cellulitis or open wounds. Lymph : No Cervical, Axillary, or Inguinal lymphadenopathy.   CBC Lab Results  Component Value Date   WBC 7.8 01/13/2015   HGB 12.2* 01/15/2015   HCT 36.0* 01/15/2015   MCV 79.8* 01/13/2015   PLT 221 01/13/2015    BMET    Component Value Date/Time   NA 139 01/15/2015 0821   NA 135 08/10/2014 0843   K 3.5 01/15/2015 0821   K 3.8 11/13/2014   CL 98* 01/13/2015 1540   CL 102 08/10/2014 0843   CO2 29 01/13/2015 1540   CO2 22 08/10/2014 0843   GLUCOSE 108 01/15/2015 0821   GLUCOSE 108* 01/15/2015 0821   GLUCOSE 80 08/10/2014 0843   BUN 29* 01/13/2015 1540   BUN 84* 08/10/2014 0843   CREATININE 3.78* 01/13/2015 1540   CREATININE 4.35* 08/10/2014 0843   CALCIUM 8.7* 01/13/2015 1540   CALCIUM 7.3* 08/10/2014 0843   GFRNONAA 15* 01/13/2015 1540   GFRNONAA 13* 08/10/2014 0843   GFRNONAA 18* 04/16/2014 1017    GFRAA 18* 01/13/2015 1540   GFRAA 15* 08/10/2014 0843   GFRAA 21* 04/16/2014 1017   Estimated Creatinine Clearance: 18.2 mL/min (by C-G formula based on Cr of 3.78).  COAG Lab Results  Component Value Date   INR 1.27 01/13/2015    Radiology Chest 2 View  01/13/2015   CLINICAL DATA:  Preop vascular surgery. History of hypertension, CAD  EXAM: CHEST  2 VIEW  COMPARISON:  None.  FINDINGS: Right dialysis catheter remains in place, unchanged. Prior median sternotomy and valve replacement. Heart is borderline in size. No confluent airspace opacities or effusions. No acute bony abnormality.  IMPRESSION: No active cardiopulmonary disease.   Electronically Signed   By: Charlett Nose M.D.  On: 01/13/2015 16:20    Assessment/Plan 1. End stage renal disease requiring hemodialysis. The patient has a nicely matured basilic vein and therefore is undergoing transposition. The risks and benefits as well as the alternative therapies have been reviewed all questions of been answered patient agrees to proceed with surgical transposition of the AV fistula   Schnier, Latina Craver, MD  01/15/2015 10:21 AM

## 2015-01-15 NOTE — Transfer of Care (Signed)
Immediate Anesthesia Transfer of Care Note  Patient: Geoffrey Stevens  Procedure(s) Performed: Procedure(s): BASCILIC VEIN TRANSPOSITION (Right)  Patient Location: PACU  Anesthesia Type:General  Level of Consciousness: sedated  Airway & Oxygen Therapy: Patient Spontanous Breathing and Patient connected to face mask oxygen  Post-op Assessment: Report given to RN and Post -op Vital signs reviewed and stable  Post vital signs: Reviewed and stable  Last Vitals:  Filed Vitals:   01/15/15 1327  BP: 121/79  Pulse: 94  Temp: 37.1 C  Resp: 17    Complications: No apparent anesthesia complications

## 2015-10-25 ENCOUNTER — Other Ambulatory Visit
Admission: RE | Admit: 2015-10-25 | Discharge: 2015-10-25 | Disposition: A | Discharge status: Home / Self Care | Payer: Medicare Other | Source: Ambulatory Visit | Attending: Internal Medicine | Admitting: Internal Medicine

## 2015-10-25 DIAGNOSIS — R609 Edema, unspecified: Secondary | ICD-10-CM | POA: Diagnosis not present

## 2015-10-25 DIAGNOSIS — D631 Anemia in chronic kidney disease: Secondary | ICD-10-CM | POA: Insufficient documentation

## 2015-10-25 DIAGNOSIS — N186 End stage renal disease: Secondary | ICD-10-CM | POA: Insufficient documentation

## 2015-10-25 LAB — CBC WITH DIFFERENTIAL/PLATELET
BASOS ABS: 0 10*3/uL (ref 0–0.1)
Basophils Relative: 1 %
EOS PCT: 7 %
Eosinophils Absolute: 0.4 10*3/uL (ref 0–0.7)
HEMATOCRIT: 37.8 % — AB (ref 40.0–52.0)
Hemoglobin: 12 g/dL — ABNORMAL LOW (ref 13.0–18.0)
LYMPHS PCT: 16 %
Lymphs Abs: 0.9 10*3/uL — ABNORMAL LOW (ref 1.0–3.6)
MCH: 26.9 pg (ref 26.0–34.0)
MCHC: 31.9 g/dL — AB (ref 32.0–36.0)
MCV: 84.5 fL (ref 80.0–100.0)
MONO ABS: 0.5 10*3/uL (ref 0.2–1.0)
MONOS PCT: 8 %
Neutro Abs: 4 10*3/uL (ref 1.4–6.5)
Neutrophils Relative %: 68 %
PLATELETS: 175 10*3/uL (ref 150–440)
RBC: 4.47 MIL/uL (ref 4.40–5.90)
RDW: 19 % — AB (ref 11.5–14.5)
WBC: 5.8 10*3/uL (ref 3.8–10.6)

## 2015-10-25 LAB — CREATININE, SERUM
Creatinine, Ser: 8.85 mg/dL — ABNORMAL HIGH (ref 0.61–1.24)
GFR calc Af Amer: 6 mL/min — ABNORMAL LOW (ref >60)
GFR, EST NON AFRICAN AMERICAN: 5 mL/min — AB (ref >60)

## 2015-10-25 LAB — BUN: BUN: 22 mg/dL — ABNORMAL HIGH (ref 6–20)

## 2015-11-01 ENCOUNTER — Other Ambulatory Visit
Admission: RE | Admit: 2015-11-01 | Discharge: 2015-11-01 | Disposition: A | Discharge status: Home / Self Care | Payer: Medicare Other | Source: Ambulatory Visit | Attending: Internal Medicine | Admitting: Internal Medicine

## 2015-11-01 DIAGNOSIS — M869 Osteomyelitis, unspecified: Secondary | ICD-10-CM | POA: Insufficient documentation

## 2016-03-01 DEATH — deceased

## 2016-03-12 IMAGING — CR DG CHEST 2V
1 series · 2 of 2 positions shown · non-contrast
Comparison: None.

CLINICAL DATA: Preop vascular surgery. History of hypertension, CAD

EXAM:
CHEST  2 VIEW

[Series 1: dg chest 2 view · 0.14mm/px · 2 of 2 slices shown]
[im 1/2]
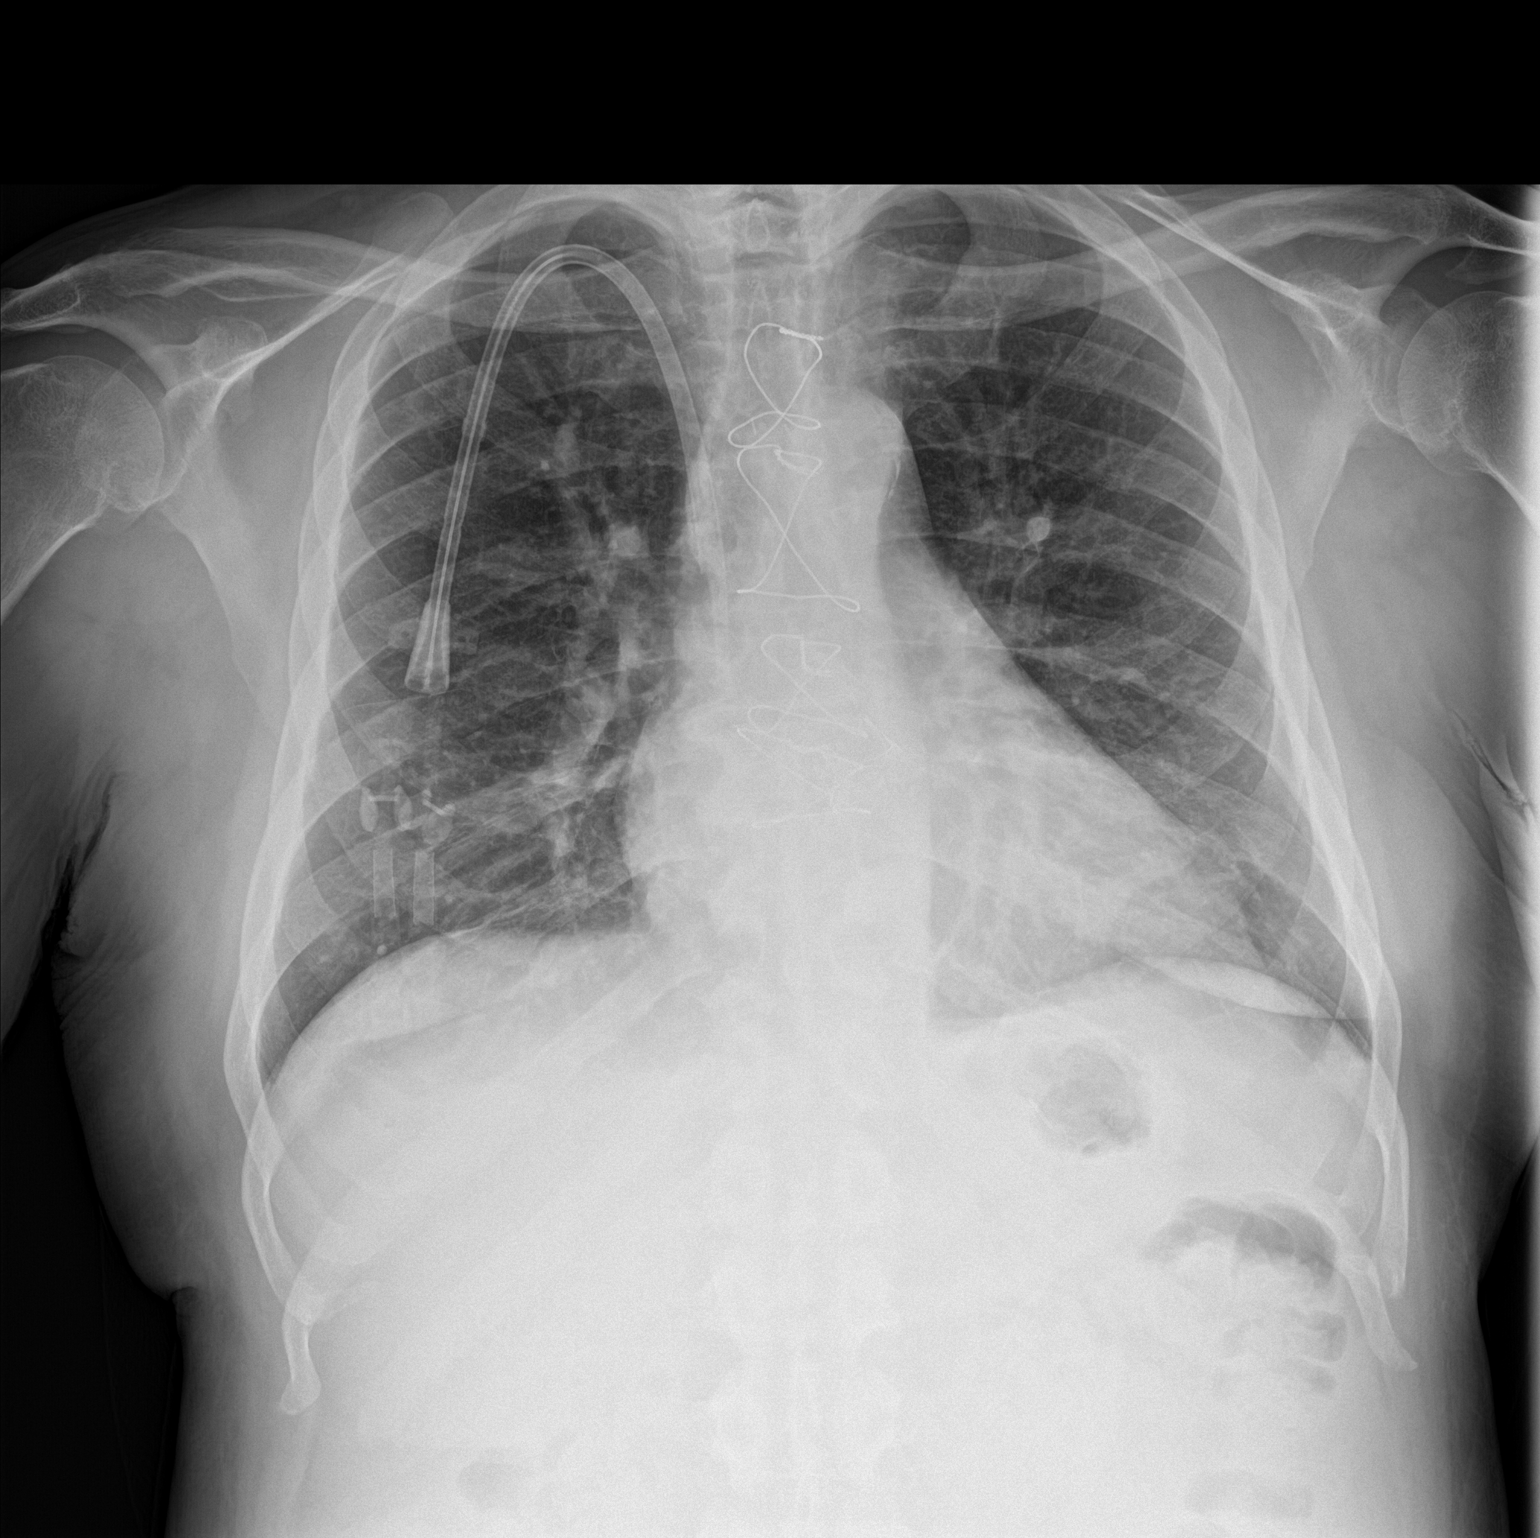
[im 2/2]
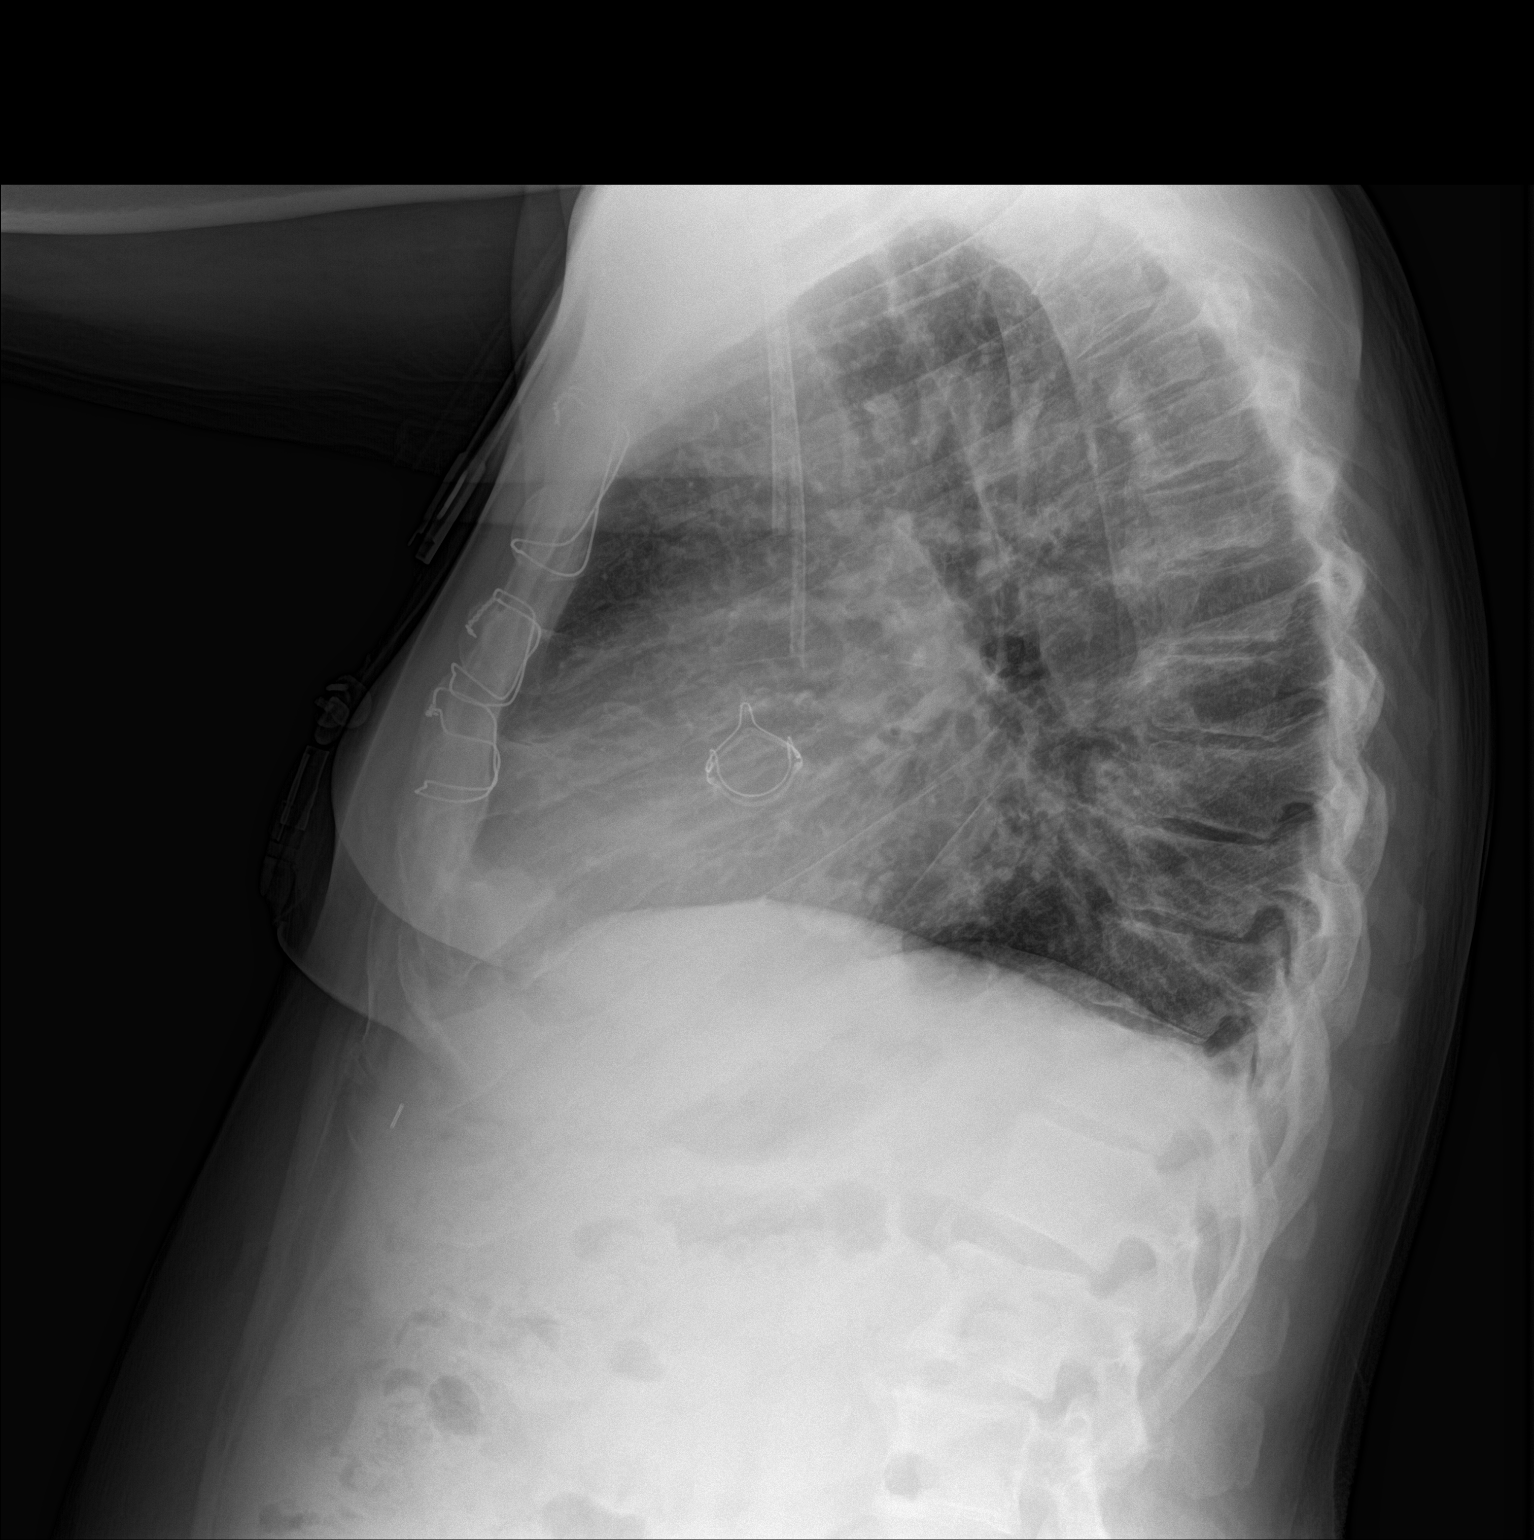

[2 of 2 positions shown; findings below may reference images not displayed]

FINDINGS: Right dialysis catheter remains in place, unchanged. Prior median
sternotomy and valve replacement. Heart is borderline in size. No
confluent airspace opacities or effusions. No acute bony
abnormality.
IMPRESSION: No active cardiopulmonary disease.
